# Patient Record
Sex: Male | Born: 1990 | Race: Black or African American | Hispanic: No | Marital: Single | Smoking: Current every day smoker
Health system: Southern US, Community
[De-identification: ages and names within clinical notes are randomized; demographics above are authoritative.]

## PROBLEM LIST (undated history)

## (undated) DIAGNOSIS — J45909 Unspecified asthma, uncomplicated: Secondary | ICD-10-CM

## (undated) DIAGNOSIS — I10 Essential (primary) hypertension: Secondary | ICD-10-CM

## (undated) HISTORY — PX: FRACTURE SURGERY: SHX138

---

## 2010-08-23 ENCOUNTER — Emergency Department (HOSPITAL_BASED_OUTPATIENT_CLINIC_OR_DEPARTMENT_OTHER): Admission: EM | Admit: 2010-08-23 | Discharge: 2010-08-23 | Payer: Self-pay | Admitting: Emergency Medicine

## 2010-08-23 ENCOUNTER — Ambulatory Visit: Payer: Self-pay | Admitting: Diagnostic Radiology

## 2010-11-16 ENCOUNTER — Emergency Department (HOSPITAL_BASED_OUTPATIENT_CLINIC_OR_DEPARTMENT_OTHER)
Admission: EM | Admit: 2010-11-16 | Discharge: 2010-11-16 | Payer: Self-pay | Source: Home / Self Care | Admitting: Emergency Medicine

## 2011-03-06 ENCOUNTER — Emergency Department (HOSPITAL_BASED_OUTPATIENT_CLINIC_OR_DEPARTMENT_OTHER)
Admission: EM | Admit: 2011-03-06 | Discharge: 2011-03-06 | Disposition: A | Discharge status: Home / Self Care | Payer: Medicaid Other | Attending: Emergency Medicine | Admitting: Emergency Medicine

## 2011-03-06 DIAGNOSIS — R071 Chest pain on breathing: Secondary | ICD-10-CM | POA: Insufficient documentation

## 2011-03-06 DIAGNOSIS — R0789 Other chest pain: Secondary | ICD-10-CM | POA: Insufficient documentation

## 2011-05-08 ENCOUNTER — Emergency Department (HOSPITAL_BASED_OUTPATIENT_CLINIC_OR_DEPARTMENT_OTHER)
Admission: EM | Admit: 2011-05-08 | Discharge: 2011-05-08 | Disposition: A | Discharge status: Home / Self Care | Payer: Medicaid Other | Attending: Emergency Medicine | Admitting: Emergency Medicine

## 2011-05-08 DIAGNOSIS — H5710 Ocular pain, unspecified eye: Secondary | ICD-10-CM | POA: Insufficient documentation

## 2011-05-08 DIAGNOSIS — H209 Unspecified iridocyclitis: Secondary | ICD-10-CM | POA: Insufficient documentation

## 2011-05-08 DIAGNOSIS — W219XXA Striking against or struck by unspecified sports equipment, initial encounter: Secondary | ICD-10-CM | POA: Insufficient documentation

## 2011-05-08 DIAGNOSIS — J45909 Unspecified asthma, uncomplicated: Secondary | ICD-10-CM | POA: Insufficient documentation

## 2011-05-08 DIAGNOSIS — Y9367 Activity, basketball: Secondary | ICD-10-CM | POA: Insufficient documentation

## 2011-05-25 ENCOUNTER — Encounter: Payer: Self-pay | Admitting: Emergency Medicine

## 2011-05-25 ENCOUNTER — Emergency Department (HOSPITAL_BASED_OUTPATIENT_CLINIC_OR_DEPARTMENT_OTHER)
Admission: EM | Admit: 2011-05-25 | Discharge: 2011-05-25 | Disposition: A | Discharge status: Home / Self Care | Payer: Medicaid Other | Attending: Emergency Medicine | Admitting: Emergency Medicine

## 2011-05-25 ENCOUNTER — Emergency Department (INDEPENDENT_AMBULATORY_CARE_PROVIDER_SITE_OTHER): Payer: Medicaid Other

## 2011-05-25 DIAGNOSIS — M25541 Pain in joints of right hand: Secondary | ICD-10-CM

## 2011-05-25 DIAGNOSIS — M25549 Pain in joints of unspecified hand: Secondary | ICD-10-CM

## 2011-05-25 MED ORDER — IBUPROFEN 800 MG PO TABS: 800.0000 mg | ORAL_TABLET | Freq: Three times a day (TID) | ORAL | Status: AC

## 2011-05-25 NOTE — ED Notes (Signed)
Report received from Shary Key, RN. Pt states he has been to xray and is awaiting results. Ice pack given.

## 2011-05-25 NOTE — ED Notes (Signed)
No PCP 

## 2011-05-25 NOTE — ED Provider Notes (Signed)
History     Chief Complaint  Patient presents with  . Hand Injury   HPI Pt hit back of right hand on a door while feeding his dogs this am.  Has pain at 3rd MCP joint when he makes a fist and it feels like something in joint is moving when he palpates.  Has not taken anything for pain.   History reviewed. No pertinent past medical history.  History reviewed. No pertinent past surgical history.  No family history on file.  History  Substance Use Topics  . Smoking status: Never Smoker   . Smokeless tobacco: Not on file  . Alcohol Use: No      Review of Systems  All other systems reviewed and are negative.    Physical Exam  BP 130/73  Pulse 73  Temp(Src) 98.5 F (36.9 C) (Oral)  Resp 16  Ht 5\' 10"  (1.778 m)  Wt 149 lb (67.586 kg)  BMI 21.38 kg/m2  SpO2 100%  Physical Exam  Nursing note and vitals reviewed. Constitutional: He is oriented to person, place, and time. He appears well-developed and well-nourished. No distress.  HENT:  Head: Normocephalic and atraumatic.  Eyes:       Normal appearance  Neck: Normal range of motion.  Musculoskeletal:       Hand w/out deformity, ecchymosis or edema.  No bony tenderness.  Full active ROM of wrist and fingers.  Pt reports pain at dorsal surface of right MCP joint when makes a fist.   Neurological: He is alert and oriented to person, place, and time.  Psychiatric: He has a normal mood and affect. His behavior is normal.    ED Course  Procedures  MDM Pt presents w/ traumatic right hand pain.  No signs of trauma on exam.  Xray ordered in triage and neg for fx/dislocation.  Pt requested buddy taping of fingers for comfort and performed by nursing staff.  Discharged home w/ ibuprofen for pain.  Recommended ice and elevation as well.   Medical screening examination/treatment/procedure(s) were performed by non-physician practitioner and as supervising physician I was immediately available for  consultation/collaboration.   Arie Sabina Union, PA 05/25/11 1542  Suzi Roots, MD 06/10/11 [phone]

## 2011-05-25 NOTE — ED Notes (Signed)
Pt c/o RT hand pain s/p hitting it on a door while feeding dog

## 2011-10-07 ENCOUNTER — Emergency Department (HOSPITAL_BASED_OUTPATIENT_CLINIC_OR_DEPARTMENT_OTHER)
Admission: EM | Admit: 2011-10-07 | Discharge: 2011-10-07 | Disposition: A | Discharge status: Home / Self Care | Payer: Medicaid Other | Attending: Emergency Medicine | Admitting: Emergency Medicine

## 2011-10-07 ENCOUNTER — Encounter (HOSPITAL_BASED_OUTPATIENT_CLINIC_OR_DEPARTMENT_OTHER): Payer: Self-pay | Admitting: Emergency Medicine

## 2011-10-07 DIAGNOSIS — S335XXA Sprain of ligaments of lumbar spine, initial encounter: Secondary | ICD-10-CM | POA: Insufficient documentation

## 2011-10-07 DIAGNOSIS — S39012A Strain of muscle, fascia and tendon of lower back, initial encounter: Secondary | ICD-10-CM

## 2011-10-07 DIAGNOSIS — Y92410 Unspecified street and highway as the place of occurrence of the external cause: Secondary | ICD-10-CM | POA: Insufficient documentation

## 2011-10-07 MED ORDER — CYCLOBENZAPRINE HCL 10 MG PO TABS: 10.0000 mg | ORAL_TABLET | Freq: Two times a day (BID) | ORAL | Status: AC | PRN

## 2011-10-07 MED ORDER — IBUPROFEN 800 MG PO TABS
800.0000 mg | ORAL_TABLET | Freq: Once | ORAL | Status: AC
  Administered 2011-10-07: 800 mg via ORAL
  Filled 2011-10-07 (×2): qty 1

## 2011-10-07 MED ORDER — IBUPROFEN 800 MG PO TABS: 800.0000 mg | ORAL_TABLET | Freq: Three times a day (TID) | ORAL | Status: AC
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
## 2012-01-02 ENCOUNTER — Emergency Department (HOSPITAL_BASED_OUTPATIENT_CLINIC_OR_DEPARTMENT_OTHER)
Admission: EM | Admit: 2012-01-02 | Discharge: 2012-01-02 | Disposition: A | Discharge status: Home / Self Care | Payer: Medicaid Other | Attending: Emergency Medicine | Admitting: Emergency Medicine

## 2012-01-02 ENCOUNTER — Encounter (HOSPITAL_BASED_OUTPATIENT_CLINIC_OR_DEPARTMENT_OTHER): Payer: Self-pay | Admitting: *Deleted

## 2012-01-02 ENCOUNTER — Emergency Department (INDEPENDENT_AMBULATORY_CARE_PROVIDER_SITE_OTHER): Payer: Medicaid Other

## 2012-01-02 DIAGNOSIS — X838XXA Intentional self-harm by other specified means, initial encounter: Secondary | ICD-10-CM

## 2012-01-02 DIAGNOSIS — S62309A Unspecified fracture of unspecified metacarpal bone, initial encounter for closed fracture: Secondary | ICD-10-CM | POA: Insufficient documentation

## 2012-01-02 DIAGNOSIS — S62339A Displaced fracture of neck of unspecified metacarpal bone, initial encounter for closed fracture: Secondary | ICD-10-CM | POA: Insufficient documentation

## 2012-01-02 DIAGNOSIS — W2209XA Striking against other stationary object, initial encounter: Secondary | ICD-10-CM | POA: Insufficient documentation

## 2012-01-02 DIAGNOSIS — Y92009 Unspecified place in unspecified non-institutional (private) residence as the place of occurrence of the external cause: Secondary | ICD-10-CM | POA: Insufficient documentation

## 2012-01-02 DIAGNOSIS — Y9383 Activity, rough housing and horseplay: Secondary | ICD-10-CM | POA: Insufficient documentation

## 2012-01-02 DIAGNOSIS — S62306A Unspecified fracture of fifth metacarpal bone, right hand, initial encounter for closed fracture: Secondary | ICD-10-CM

## 2012-01-02 MED ORDER — HYDROCODONE-ACETAMINOPHEN 5-325 MG PO TABS
2.0000 | ORAL_TABLET | Freq: Once | ORAL | Status: AC
  Administered 2012-01-02: 2 via ORAL
  Filled 2012-01-02: qty 2

## 2012-01-02 MED ORDER — IBUPROFEN 800 MG PO TABS
800.0000 mg | ORAL_TABLET | Freq: Once | ORAL | Status: AC
  Administered 2012-01-02: 800 mg via ORAL
  Filled 2012-01-02: qty 1

## 2012-01-02 MED ORDER — HYDROCODONE-ACETAMINOPHEN 5-325 MG PO TABS: 2.0000 | ORAL_TABLET | Freq: Four times a day (QID) | ORAL | Status: AC | PRN

## 2012-01-02 MED ORDER — IBUPROFEN 800 MG PO TABS: 800.0000 mg | ORAL_TABLET | Freq: Three times a day (TID) | ORAL | Status: AC | PRN

## 2012-01-02 NOTE — ED Notes (Signed)
Patient transported to X-ray 

## 2012-01-02 NOTE — Discharge Instructions (Signed)
Hand Fracture, Metacarpals Fractures of metacarpals are breaks in the bones of the hand. They extend from the knuckles to the wrist. These bones can undergo many types of fractures. There are different ways of treating these fractures, all of which may be correct. TREATMENT  Hand fractures can be treated with:   Non-reduction - The fracture is casted without changing the positions of the fracture (bone pieces) involved. This fracture is usually left in a cast for 4 to 6 weeks or as your caregiver thinks necessary.   Closed reduction - The bones are moved back into position without surgery and then casted.   ORIF (open reduction and internal fixation) - The fracture site is opened and the bone pieces are fixed into place with some type of hardware, such as screws, etc. They are then casted.  Your caregiver will discuss the type of fracture you have and the treatment that should be best for that problem. If surgery is chosen, let your caregivers know about the following.  LET YOUR CAREGIVERS KNOW ABOUT:  Allergies.   Medications you are taking, including herbs, eye drops, over the counter medications, and creams.   Use of steroids (by mouth or creams).   Previous problems with anesthetics or novocaine.   Possibility of pregnancy.   History of blood clots (thrombophlebitis).   History of bleeding or blood problems.   Previous surgeries.   Other health problems.  AFTER THE PROCEDURE After surgery, you will be taken to the recovery area where a nurse will watch and check your progress. Once you are awake, stable, and taking fluids well, barring other problems, you'll be allowed to go home. Once home, an ice pack applied to your operative site may help with pain and keep the swelling down. HOME CARE INSTRUCTIONS   Follow your caregiver's instructions as to activities, exercises, physical therapy, and driving a car.   Daily exercise is helpful for keeping range of motion and strength.  Exercise as instructed.   To lessen swelling, keep the injured hand elevated above the level of your heart as much as possible.   Apply ice to the injury for 15 to 20 minutes each hour while awake for the first 2 days. Put the ice in a plastic bag and place a thin towel between the bag of ice and your cast.   Move the fingers of your casted hand several times a day.   If a plaster or fiberglass cast was applied:   Do not try to scratch the skin under the cast using a sharp or pointed object.   Check the skin around the cast every day. You may put lotion on red or sore areas.   Keep your cast dry. Your cast can be protected during bathing with a plastic bag. Do not put your cast into the water.   If a plaster splint was applied:   Wear your splint for as long as directed by your caregiver or until seen again.   Do not get your splint wet. Protect it during bathing with a plastic bag.   You may loosen the elastic bandage around the splint if your fingers start to get numb, tingle, get cold or turn blue.   Do not put pressure on your cast or splint; this may cause it to break. Especially, do not lean plaster casts on hard surfaces for 24 hours after application.   Take medications as directed by your caregiver.   Only take over-the-counter or prescription medicines for pain,   discomfort, or fever as directed by your caregiver.   Follow-up as provided by your caregiver. This is very important in order to avoid permanent injury or disability and chronic pain.  SEEK MEDICAL CARE IF:   Increased bleeding (more than a small spot) from beneath your cast or splint if there is beneath the cast as with an open reduction.   Redness, swelling, or increasing pain in the wound or from beneath your cast or splint.   Pus coming from wound or from beneath your cast or splint.   An unexplained oral temperature above 102 F (38.9 C) develops, or as your caregiver suggests.   A foul smell coming  from the wound or dressing or from beneath your cast or splint.   You have a problem moving any of your fingers.  SEEK IMMEDIATE MEDICAL CARE IF:   You develop a rash   You have difficulty breathing   You have any allergy problems  If you do not have a window in your cast for observing the wound, a discharge or minor bleeding may show up as a stain on the outside of your cast. Report these findings to your caregiver. MAKE SURE YOU:   Understand these instructions.   Will watch your condition.   Will get help right away if you are not doing well or get worse.  Document Released: 10/20/2005 Document Revised: 07/02/2011 Document Reviewed: 06/08/2008 [employer] Patient Information 2012 [employer]. 
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
## 2012-01-02 NOTE — ED Notes (Signed)
Punched a wall with his fist 3 days ago. Now his right hand is painful. Hx of fx.

## 2012-01-20 ENCOUNTER — Ambulatory Visit: Payer: Medicaid Other | Attending: Orthopedic Surgery | Admitting: Occupational Therapy

## 2012-01-20 DIAGNOSIS — M25649 Stiffness of unspecified hand, not elsewhere classified: Secondary | ICD-10-CM | POA: Insufficient documentation

## 2012-01-20 DIAGNOSIS — M25549 Pain in joints of unspecified hand: Secondary | ICD-10-CM | POA: Insufficient documentation

## 2012-01-20 DIAGNOSIS — IMO0001 Reserved for inherently not codable concepts without codable children: Secondary | ICD-10-CM | POA: Insufficient documentation

## 2012-10-27 ENCOUNTER — Encounter (HOSPITAL_BASED_OUTPATIENT_CLINIC_OR_DEPARTMENT_OTHER): Payer: Self-pay | Admitting: Emergency Medicine

## 2012-10-27 ENCOUNTER — Emergency Department (HOSPITAL_BASED_OUTPATIENT_CLINIC_OR_DEPARTMENT_OTHER)
Admission: EM | Admit: 2012-10-27 | Discharge: 2012-10-27 | Disposition: A | Discharge status: Home / Self Care | Payer: Medicaid Other | Attending: Emergency Medicine | Admitting: Emergency Medicine

## 2012-10-27 DIAGNOSIS — J029 Acute pharyngitis, unspecified: Secondary | ICD-10-CM | POA: Insufficient documentation

## 2012-10-27 DIAGNOSIS — F17200 Nicotine dependence, unspecified, uncomplicated: Secondary | ICD-10-CM | POA: Insufficient documentation

## 2012-10-27 DIAGNOSIS — R059 Cough, unspecified: Secondary | ICD-10-CM | POA: Insufficient documentation

## 2012-10-27 DIAGNOSIS — R05 Cough: Secondary | ICD-10-CM | POA: Insufficient documentation

## 2012-10-27 MED ORDER — ACETAMINOPHEN-CODEINE 120-12 MG/5ML PO SUSP: 10.0000 mL | Freq: Four times a day (QID) | ORAL | Status: DC | PRN

## 2012-10-27 NOTE — ED Provider Notes (Signed)
History     CSN: [id number]  Arrival date & time 10/27/12  1307   First MD Initiated Contact with Patient 10/27/12 1316      Chief Complaint  Patient presents with  . Cough    (Consider location/radiation/quality/duration/timing/severity/associated sxs/prior treatment) Patient is a 21 y.o. male presenting with cough. The history is provided by the patient. No language interpreter was used.  Cough This is a new problem. The current episode started yesterday. The problem occurs constantly. The problem has not changed since onset.The cough is productive of sputum. Associated symptoms include ear congestion and sore throat. He has tried nothing for the symptoms. He is a smoker.    History reviewed. No pertinent past medical history.  History reviewed. No pertinent past surgical history.  History reviewed. No pertinent family history.  History  Substance Use Topics  . Smoking status: Current Every Day Smoker -- 1.0 packs/day    Types: Cigarettes  . Smokeless tobacco: Not on file  . Alcohol Use: No      Review of Systems  Constitutional: Negative.   HENT: Positive for sore throat.   Respiratory: Positive for cough.   Cardiovascular: Negative.     Allergies  Review of patient's allergies indicates no known allergies.  Home Medications   Current Outpatient Rx  Name  Route  Sig  Dispense  Refill  . ACETAMINOPHEN-CODEINE 120-12 MG/5ML PO SUSP   Oral   Take 10 mLs by mouth every 6 (six) hours as needed for pain.   60 mL   0     BP 146/90  Pulse 77  Temp 99.1 F (37.3 C) (Oral)  Resp 18  SpO2 100%  Physical Exam  Nursing note and vitals reviewed. Constitutional: He appears well-developed and well-nourished.  HENT:  Head: Normocephalic and atraumatic.  Right Ear: External ear normal.  Left Ear: External ear normal.       Pharyngeal erythema   Neck: Normal range of motion. Neck supple.  Cardiovascular: Normal rate and regular rhythm.   Pulmonary/Chest:  Effort normal and breath sounds normal.  Musculoskeletal: Normal range of motion.  Neurological: He is alert.    ED Course  Procedures (including critical care time)  Labs Reviewed - No data to display No results found.   1. Cough       MDM  Likely viral        Teressa Lower, NP 10/27/12 1341

## 2012-10-27 NOTE — ED Notes (Signed)
Pt started developing cold symptoms yesterday, +cough, fever

## 2012-10-27 NOTE — ED Provider Notes (Signed)
Medical screening examination/treatment/procedure(s) were performed by non-physician practitioner and as supervising physician I was immediately available for consultation/collaboration.  Ethelda Chick, MD 10/27/12 [phone]

## 2014-12-29 ENCOUNTER — Emergency Department (HOSPITAL_BASED_OUTPATIENT_CLINIC_OR_DEPARTMENT_OTHER): Payer: Medicaid Other

## 2014-12-29 ENCOUNTER — Emergency Department (HOSPITAL_BASED_OUTPATIENT_CLINIC_OR_DEPARTMENT_OTHER)
Admission: EM | Admit: 2014-12-29 | Discharge: 2014-12-30 | Disposition: A | Discharge status: Home / Self Care | Payer: Medicaid Other | Attending: Emergency Medicine | Admitting: Emergency Medicine

## 2014-12-29 ENCOUNTER — Encounter (HOSPITAL_BASED_OUTPATIENT_CLINIC_OR_DEPARTMENT_OTHER): Payer: Self-pay | Admitting: Emergency Medicine

## 2014-12-29 DIAGNOSIS — S8991XA Unspecified injury of right lower leg, initial encounter: Secondary | ICD-10-CM | POA: Diagnosis present

## 2014-12-29 DIAGNOSIS — M25561 Pain in right knee: Secondary | ICD-10-CM

## 2014-12-29 DIAGNOSIS — Y9389 Activity, other specified: Secondary | ICD-10-CM | POA: Diagnosis not present

## 2014-12-29 DIAGNOSIS — S0990XA Unspecified injury of head, initial encounter: Secondary | ICD-10-CM | POA: Diagnosis not present

## 2014-12-29 DIAGNOSIS — Y998 Other external cause status: Secondary | ICD-10-CM | POA: Insufficient documentation

## 2014-12-29 DIAGNOSIS — Z720 Tobacco use: Secondary | ICD-10-CM | POA: Insufficient documentation

## 2014-12-29 DIAGNOSIS — J45909 Unspecified asthma, uncomplicated: Secondary | ICD-10-CM | POA: Diagnosis not present

## 2014-12-29 DIAGNOSIS — Y92410 Unspecified street and highway as the place of occurrence of the external cause: Secondary | ICD-10-CM | POA: Diagnosis not present

## 2014-12-29 HISTORY — DX: Unspecified asthma, uncomplicated: J45.909

## 2014-12-29 MED ORDER — OXYCODONE-ACETAMINOPHEN 5-325 MG PO TABS
2.0000 | ORAL_TABLET | Freq: Once | ORAL | Status: AC
  Administered 2014-12-29: 2 via ORAL
  Filled 2014-12-29: qty 2
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
## 2014-12-29 NOTE — ED Notes (Signed)
Pt has bloodshot eyes
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
## 2014-12-30 MED ORDER — TRAMADOL HCL 50 MG PO TABS: 50.0000 mg | ORAL_TABLET | Freq: Four times a day (QID) | ORAL | Status: DC | PRN
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
## 2016-09-06 ENCOUNTER — Emergency Department (HOSPITAL_BASED_OUTPATIENT_CLINIC_OR_DEPARTMENT_OTHER): Payer: Medicaid Other

## 2016-09-06 ENCOUNTER — Encounter (HOSPITAL_BASED_OUTPATIENT_CLINIC_OR_DEPARTMENT_OTHER): Payer: Self-pay | Admitting: Emergency Medicine

## 2016-09-06 ENCOUNTER — Emergency Department (HOSPITAL_BASED_OUTPATIENT_CLINIC_OR_DEPARTMENT_OTHER)
Admission: EM | Admit: 2016-09-06 | Discharge: 2016-09-06 | Disposition: A | Discharge status: Home / Self Care | Payer: Medicaid Other | Attending: Emergency Medicine | Admitting: Emergency Medicine

## 2016-09-06 DIAGNOSIS — Y998 Other external cause status: Secondary | ICD-10-CM | POA: Insufficient documentation

## 2016-09-06 DIAGNOSIS — Y929 Unspecified place or not applicable: Secondary | ICD-10-CM | POA: Insufficient documentation

## 2016-09-06 DIAGNOSIS — W2101XA Struck by football, initial encounter: Secondary | ICD-10-CM | POA: Diagnosis not present

## 2016-09-06 DIAGNOSIS — J45909 Unspecified asthma, uncomplicated: Secondary | ICD-10-CM | POA: Diagnosis not present

## 2016-09-06 DIAGNOSIS — S6992XA Unspecified injury of left wrist, hand and finger(s), initial encounter: Secondary | ICD-10-CM | POA: Diagnosis present

## 2016-09-06 DIAGNOSIS — S62635A Displaced fracture of distal phalanx of left ring finger, initial encounter for closed fracture: Secondary | ICD-10-CM | POA: Insufficient documentation

## 2016-09-06 DIAGNOSIS — Y9361 Activity, american tackle football: Secondary | ICD-10-CM | POA: Insufficient documentation

## 2016-09-06 DIAGNOSIS — S62609A Fracture of unspecified phalanx of unspecified finger, initial encounter for closed fracture: Secondary | ICD-10-CM

## 2016-09-06 DIAGNOSIS — F17210 Nicotine dependence, cigarettes, uncomplicated: Secondary | ICD-10-CM | POA: Diagnosis not present

## 2016-09-06 MED ORDER — ACETAMINOPHEN 500 MG PO TABS
1000.0000 mg | ORAL_TABLET | Freq: Once | ORAL | Status: AC
  Administered 2016-09-06: 1000 mg via ORAL
  Filled 2016-09-06: qty 2

## 2016-09-06 MED ORDER — IBUPROFEN 400 MG PO TABS
400.0000 mg | ORAL_TABLET | Freq: Once | ORAL | Status: AC
  Administered 2016-09-06: 400 mg via ORAL
  Filled 2016-09-06: qty 1

## 2016-09-06 NOTE — ED Notes (Signed)
Pt was throwing football and injured his left ring finger. MD at bedside to reduce.

## 2016-09-06 NOTE — ED Provider Notes (Signed)
[facility] Note   CSN: [id number] Arrival date & time: 09/06/16  1257   By signing my name below, I, Arianna Nassar, attest that this documentation has been prepared under the direction and in the presence of Alvira MondayErin Bernarda Erck, MD.  Electronically Signed: Octavia HeirArianna Nassar, ED Scribe. 09/06/16. 3:19 PM.   History   Chief Complaint Chief Complaint  Patient presents with  . Finger Injury    The history is provided by the patient. No language interpreter was used.   HPI Comments: Jeffrey Newman is a 25 y.o. male who is right hand dominant presents to the Emergency Department complaining of 10/10, gradual worsening, left ring finger pain s/p an injury that occurred earlier this afternoon. Associated numbness at the tip of the finger. Pt reports that he was trying to catch a football with his left hand and the ball hit his left ring finger. Pt has not had similar pain in this finger in the past. He is unable to make a fist with his left hand secondary to pain. Pt has not taken any medication to relieve his pain. There are no other complaints. Pt has not known drug allergies.   Past Medical History:  Diagnosis Date  . Asthma     There are no active problems to display for this patient.   Past Surgical History:  Procedure Laterality Date  . FRACTURE SURGERY         Home Medications    Prior to Admission medications   Medication Sig Start Date End Date Taking? Authorizing Provider  acetaminophen-codeine 120-12 MG/5ML suspension Take 10 mLs by mouth every 6 (six) hours as needed for pain. 10/27/12   Teressa LowerVrinda Pickering, NP  traMADol (ULTRAM) 50 MG tablet Take 1 tablet (50 mg total) by mouth every 6 (six) hours as needed. 12/30/14   Dione Boozeavid Glick, MD    Family History History reviewed. No pertinent family history.  Social History Social History  Substance Use Topics  . Smoking status: Current Every Day Smoker    Packs/day: 0.50    Types: Cigarettes  .  Smokeless tobacco: Not on file  . Alcohol use Yes     Allergies   Patient has no known allergies.   Review of Systems Review of Systems  Constitutional: Negative for fever.  Respiratory: Negative for shortness of breath.   Cardiovascular: Negative for chest pain.  Gastrointestinal: Negative for nausea and vomiting.  Musculoskeletal: Positive for arthralgias. Negative for back pain and neck pain.  Skin: Negative for wound.  Neurological: Positive for numbness. Negative for headaches.  All other systems reviewed and are negative.    Physical Exam Updated Vital Signs BP 129/78   Pulse (!) 58   Temp 98.1 F (36.7 C)   Resp 16   Ht 6' (1.829 m)   Wt 160 lb (72.6 kg)   SpO2 100%   BMI 21.70 kg/m   Physical Exam  Constitutional: He is oriented to person, place, and time. He appears well-developed and well-nourished.  HENT:  Head: Normocephalic.  Eyes: EOM are normal.  Neck: Normal range of motion.  Cardiovascular: Normal rate, regular rhythm and intact distal pulses.   Pulmonary/Chest: Effort normal. No respiratory distress.  Abdominal: He exhibits no distension.  Musculoskeletal: Normal range of motion. He exhibits tenderness.  pulses intact,  2+ radial pulses, prior to reduction unable to flex/extend fingers or hand secondary to pain. tenderness over DIP joint of the left ring finger, no tenderness to rest of left hand, good cap  refill,   Post reduction able to flex and extend the DIP and PIP joint after reduction, normal sensation post reduction, normal cap refill  Neurological: He is alert and oriented to person, place, and time.  Skin: Capillary refill takes less than 2 seconds.  Psychiatric: He has a normal mood and affect.  Nursing note and vitals reviewed.    ED Treatments / Results  DIAGNOSTIC STUDIES: Oxygen Saturation is 98% on RA, normal by my interpretation.  COORDINATION OF CARE:  3:11 PM Discussed treatment plan with pt at bedside and pt agreed to  plan. Will order second DG of left hand and tylenol/ibuprofen. Pt will receive a splint for his left ring finger.  Labs (all labs ordered are listed, but only abnormal results are displayed) Labs Reviewed - No data to display  EKG  EKG Interpretation None       Radiology Dg Hand Complete Left  Result Date: 09/06/2016 CLINICAL DATA:  Blunt trauma to the fourth digit playing football with pain, initial encounter EXAM: LEFT HAND - COMPLETE 3+ VIEW COMPARISON:  05/22/2013 FINDINGS: There is dislocation of the fourth DIP joint with posterior displacement of the distal phalanx with respect to the middle phalanx. Few tiny bony densities are noted on the lateral projection which may be related to small avulsions. No other focal abnormality is seen. IMPRESSION: Fourth DIP joint dislocation. There may be a few small avulsions noted on the lateral projection volarly. Electronically Signed   By: Alcide Clever M.D.   On: 09/06/2016 13:54   Dg Finger Ring Left  Result Date: 09/06/2016 CLINICAL DATA:  Dislocation LEFT ring finger post reduction EXAM: LEFT RING FINGER 2+V COMPARISON:  Earlier LEFT hand radiographs of 09/06/2016 FINDINGS: Osseous mineralization normal. Joint spaces normal. Previously identified dorsal dislocation of DIP joint LEFT ring finger appears reduced. Tiny bone density seen volar to the head of the middle phalanx question tiny fracture fragment again noted. No additional fracture seen. IMPRESSION: Interval reduction of DIP dislocation LEFT ring finger. Question tiny displaced fracture fragment at volar aspect of DIP joint. Electronically Signed   By: Ulyses Southward M.D.   On: 09/06/2016 15:39    Procedures Reduction of dislocation Date/Time: 09/07/2016 9:38 AM Performed by: Alvira Monday Authorized by: Alvira Monday  Consent: Verbal consent obtained. Risks and benefits: risks, benefits and alternatives were discussed Consent given by: patient Patient understanding: patient  states understanding of the procedure being performed Patient consent: the patient's understanding of the procedure matches consent given Procedure consent: procedure consent matches procedure scheduled Relevant documents: relevant documents present and verified Test results: test results available and properly labeled Site marked: the operative site was marked Imaging studies: imaging studies available Required items: required blood products, implants, devices, and special equipment available Patient identity confirmed: verbally with patient Time out: Immediately prior to procedure a "time out" was called to verify the correct patient, procedure, equipment, support staff and site/side marked as required. Local anesthesia used: no  Anesthesia: Local anesthesia used: no  Sedation: Patient sedated: no Patient tolerance: Patient tolerated the procedure well with no immediate complications    (including critical care time)  Medications Ordered in ED Medications  ibuprofen (ADVIL,MOTRIN) tablet 400 mg (400 mg Oral Given 09/06/16 1523)  acetaminophen (TYLENOL) tablet 1,000 mg (1,000 mg Oral Given 09/06/16 1523)     Initial Impression / Assessment and Plan / ED Course  I have reviewed the triage vital signs and the nursing notes.  Pertinent labs & imaging results that were  available during my care of the patient were reviewed by me and considered in my medical decision making (see chart for details).  Clinical Course    25yo male presents with concern for finger pain and deformity. XR shows dorsal dislocation with possible small avulsion fracture fragments.  Reduction performed at bedside with repeat exam showing ability to flex and extend, resolution of sensation of numbness, normal cap refill. Placed in splint, given number for hand surgery follow up. Patient discharged in stable condition with understanding of reasons to return.   Final Clinical Impressions(s) / ED Diagnoses   Final  diagnoses:  Closed fracture dislocation of distal interphalangeal (DIP) joint of finger, initial encounter, left ring finger  I personally performed the services described in this documentation, which was scribed in my presence. The recorded information has been reviewed and is accurate.  New Prescriptions Discharge Medication List as of 09/06/2016  3:55 PM       Alvira MondayErin Guillermo Nehring, MD 09/07/16 [phone]

## 2016-09-06 NOTE — ED Triage Notes (Signed)
Pt in with finger pain after catching a football and having acute pain. States he thinks it is broken, cannot make a fist. Pt alert, interactive, ambulatory in NAD.

## 2016-12-13 ENCOUNTER — Encounter (HOSPITAL_BASED_OUTPATIENT_CLINIC_OR_DEPARTMENT_OTHER): Payer: Self-pay | Admitting: *Deleted

## 2016-12-13 ENCOUNTER — Emergency Department (HOSPITAL_BASED_OUTPATIENT_CLINIC_OR_DEPARTMENT_OTHER): Payer: Medicaid Other

## 2016-12-13 ENCOUNTER — Emergency Department (HOSPITAL_BASED_OUTPATIENT_CLINIC_OR_DEPARTMENT_OTHER)
Admission: EM | Admit: 2016-12-13 | Discharge: 2016-12-13 | Disposition: A | Discharge status: Home / Self Care | Payer: Medicaid Other | Attending: Emergency Medicine | Admitting: Emergency Medicine

## 2016-12-13 DIAGNOSIS — Y92410 Unspecified street and highway as the place of occurrence of the external cause: Secondary | ICD-10-CM | POA: Diagnosis not present

## 2016-12-13 DIAGNOSIS — F17210 Nicotine dependence, cigarettes, uncomplicated: Secondary | ICD-10-CM | POA: Diagnosis not present

## 2016-12-13 DIAGNOSIS — Y9389 Activity, other specified: Secondary | ICD-10-CM | POA: Diagnosis not present

## 2016-12-13 DIAGNOSIS — R519 Headache, unspecified: Secondary | ICD-10-CM

## 2016-12-13 DIAGNOSIS — S3992XA Unspecified injury of lower back, initial encounter: Secondary | ICD-10-CM | POA: Diagnosis present

## 2016-12-13 DIAGNOSIS — S39012A Strain of muscle, fascia and tendon of lower back, initial encounter: Secondary | ICD-10-CM | POA: Diagnosis not present

## 2016-12-13 DIAGNOSIS — Y999 Unspecified external cause status: Secondary | ICD-10-CM | POA: Diagnosis not present

## 2016-12-13 DIAGNOSIS — R51 Headache: Secondary | ICD-10-CM | POA: Insufficient documentation

## 2016-12-13 MED ORDER — CYCLOBENZAPRINE HCL 10 MG PO TABS
10.0000 mg | ORAL_TABLET | Freq: Two times a day (BID) | ORAL | 0 refills | Status: DC | PRN
Start: 2016-12-13 — End: 2020-09-21

## 2016-12-13 MED ORDER — KETOROLAC TROMETHAMINE 30 MG/ML IJ SOLN
30.0000 mg | Freq: Once | INTRAMUSCULAR | Status: DC
  Filled 2016-12-13: qty 1

## 2016-12-13 MED ORDER — NAPROXEN 375 MG PO TABS: 375.0000 mg | ORAL_TABLET | Freq: Two times a day (BID) | ORAL | 0 refills | Status: DC

## 2016-12-13 NOTE — Discharge Instructions (Signed)
All other imaging has been normal today. Have given a prescription for naproxen. Please start taking this tomorrow. Avoid using any other NSAIDs including ibuprofen, Advil, Motrin. May take Tylenol though. Take the Flexeril for muscle relaxation. This medicine will make you drowsy so do not drive with it. This is likely musculoskeletal strain. Follow up with your chiropractor on Monday. Return to the ED develop any worsening symptoms.
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
## 2017-06-17 ENCOUNTER — Encounter (HOSPITAL_BASED_OUTPATIENT_CLINIC_OR_DEPARTMENT_OTHER): Payer: Self-pay | Admitting: *Deleted

## 2017-06-17 ENCOUNTER — Emergency Department (HOSPITAL_BASED_OUTPATIENT_CLINIC_OR_DEPARTMENT_OTHER)
Admission: EM | Admit: 2017-06-17 | Discharge: 2017-06-17 | Disposition: A | Discharge status: Home / Self Care | Payer: Medicaid Other | Attending: Emergency Medicine | Admitting: Emergency Medicine

## 2017-06-17 DIAGNOSIS — R0981 Nasal congestion: Secondary | ICD-10-CM | POA: Diagnosis present

## 2017-06-17 DIAGNOSIS — Z79899 Other long term (current) drug therapy: Secondary | ICD-10-CM | POA: Diagnosis not present

## 2017-06-17 DIAGNOSIS — F17210 Nicotine dependence, cigarettes, uncomplicated: Secondary | ICD-10-CM | POA: Insufficient documentation

## 2017-06-17 DIAGNOSIS — J069 Acute upper respiratory infection, unspecified: Secondary | ICD-10-CM | POA: Diagnosis not present

## 2017-06-17 MED ORDER — NAPROXEN 375 MG PO TABS: 375.0000 mg | ORAL_TABLET | Freq: Two times a day (BID) | ORAL | 0 refills | Status: DC

## 2017-06-17 MED ORDER — PSEUDOEPHEDRINE HCL 30 MG/5ML PO SYRP
30.0000 mg | ORAL_SOLUTION | Freq: Once | ORAL | Status: DC
  Filled 2017-06-17: qty 5

## 2017-06-17 MED ORDER — PSEUDOEPHEDRINE HCL 30 MG PO TABS
30.0000 mg | ORAL_TABLET | Freq: Three times a day (TID) | ORAL | Status: AC | PRN
  Administered 2017-06-17: 30 mg via ORAL

## 2017-06-17 MED ORDER — NAPROXEN 250 MG PO TABS
500.0000 mg | ORAL_TABLET | Freq: Once | ORAL | Status: AC
  Administered 2017-06-17: 500 mg via ORAL
  Filled 2017-06-17: qty 2

## 2017-06-17 MED ORDER — PSEUDOEPHEDRINE HCL 30 MG PO TABS
ORAL_TABLET | ORAL | Status: AC
  Filled 2017-06-17: qty 1

## 2017-06-17 MED ORDER — PSEUDOEPHEDRINE HCL 30 MG/5ML PO SYRP: 30.0000 mg | ORAL_SOLUTION | Freq: Three times a day (TID) | ORAL | 0 refills | Status: DC | PRN

## 2017-06-17 NOTE — ED Triage Notes (Signed)
Pt states nasal congestion, cough and throat irritation since yesterday. Denies fevers

## 2017-06-17 NOTE — ED Provider Notes (Signed)
[facility]   CSN: [id number] Arrival date & time: 06/17/17  0000     History   Chief Complaint Chief Complaint  Patient presents with  . Nasal Congestion    HPI Jeffrey Newman is a 26 y.o. male.  HPI Pt comes in with cc of congestion and sore throat. Pt started feeling congested 2 days ago and now he has some pain with  swallowing. Pt also has a cough, dry. No chest pain, dib, n/v/f/c   History reviewed. No pertinent past medical history.  There are no active problems to display for this patient.   Past Surgical History:  Procedure Laterality Date  . FRACTURE SURGERY         Home Medications    Prior to Admission medications   Medication Sig Start Date End Date Taking? Authorizing Provider  cyclobenzaprine (FLEXERIL) 10 MG tablet Take 1 tablet (10 mg total) by mouth 2 (two) times daily as needed for muscle spasms. 12/13/16   Rise Mu, PA-C  naproxen (NAPROSYN) 375 MG tablet Take 1 tablet (375 mg total) by mouth 2 (two) times daily. 06/17/17   Derwood Kaplan, MD  pseudoephedrine (SUDAFED) 30 MG/5ML syrup Take 5 mLs (30 mg total) by mouth every 8 (eight) hours as needed for congestion. 06/17/17   Derwood Kaplan, MD    Family History No family history on file.  Social History Social History  Substance Use Topics  . Smoking status: Current Every Day Smoker    Packs/day: 0.50    Types: Cigarettes  . Smokeless tobacco: Never Used  . Alcohol use Yes     Allergies   Patient has no known allergies.   Review of Systems Review of Systems  HENT: Positive for congestion, postnasal drip, rhinorrhea and sore throat. Negative for dental problem, drooling, trouble swallowing and voice change.   Respiratory: Positive for cough.   Skin: Negative for rash.  Allergic/Immunologic: Negative for immunocompromised state.     Physical Exam Updated Vital Signs BP (!) 151/94 (BP Location: Left Arm)   Pulse 68   Temp 98.2 F (36.8  C) (Oral)   Resp 18   Ht 6\' 3"  (1.905 m)   Wt 73.8 kg (162 lb 11.2 oz)   SpO2 100%   BMI 20.34 kg/m   Physical Exam  Constitutional: He is oriented to person, place, and time. He appears well-developed.  HENT:  Head: Atraumatic.  Mouth/Throat: No oropharyngeal exudate.  Pt has no trismus, stridor or crepitus over the neck. Pt has no tonsillar enlargement and exudates. Pharynx is erythematous.   Eyes: EOM are normal.  Neck: Neck supple.  Cardiovascular: Normal rate.   Pulmonary/Chest: Effort normal.  Lymphadenopathy:    He has cervical adenopathy.  Neurological: He is alert and oriented to person, place, and time.  Skin: Skin is warm.  Nursing note and vitals reviewed.    ED Treatments / Results  Labs (all labs ordered are listed, but only abnormal results are displayed) Labs Reviewed - No data to display  EKG  EKG Interpretation None       Radiology No results found.  Procedures Procedures (including critical care time)  Medications Ordered in ED Medications  pseudoephedrine (SUDAFED) 30 MG/5ML syrup 30 mg (not administered)  naproxen (NAPROSYN) tablet 500 mg (not administered)     Initial Impression / Assessment and Plan / ED Course  I have reviewed the triage vital signs and the nursing notes.  Pertinent labs & imaging results that were available  during my care of the patient were reviewed by me and considered in my medical decision making (see chart for details).    Pt comes in with cc of sore throat and nasal congestion. pt has a viral URI.    Final Clinical Impressions(s) / ED Diagnoses   Final diagnoses:  Nasal congestion  Acute upper respiratory infection    New Prescriptions New Prescriptions   NAPROXEN (NAPROSYN) 375 MG TABLET    Take 1 tablet (375 mg total) by mouth 2 (two) times daily.   PSEUDOEPHEDRINE (SUDAFED) 30 MG/5ML SYRUP    Take 5 mLs (30 mg total) by mouth every 8 (eight) hours as needed for congestion.     Derwood KaplanNanavati,  Tamorah Hada, MD 06/17/17 [phone]

## 2017-08-30 ENCOUNTER — Encounter (HOSPITAL_BASED_OUTPATIENT_CLINIC_OR_DEPARTMENT_OTHER): Payer: Self-pay | Admitting: Emergency Medicine

## 2017-08-30 ENCOUNTER — Emergency Department (HOSPITAL_BASED_OUTPATIENT_CLINIC_OR_DEPARTMENT_OTHER)
Admission: EM | Admit: 2017-08-30 | Discharge: 2017-08-30 | Disposition: A | Discharge status: Home / Self Care | Payer: Medicaid Other | Attending: Emergency Medicine | Admitting: Emergency Medicine

## 2017-08-30 DIAGNOSIS — Y929 Unspecified place or not applicable: Secondary | ICD-10-CM | POA: Insufficient documentation

## 2017-08-30 DIAGNOSIS — Y998 Other external cause status: Secondary | ICD-10-CM | POA: Diagnosis not present

## 2017-08-30 DIAGNOSIS — Y9389 Activity, other specified: Secondary | ICD-10-CM | POA: Insufficient documentation

## 2017-08-30 DIAGNOSIS — W268XXA Contact with other sharp object(s), not elsewhere classified, initial encounter: Secondary | ICD-10-CM | POA: Insufficient documentation

## 2017-08-30 DIAGNOSIS — S1191XA Laceration without foreign body of unspecified part of neck, initial encounter: Secondary | ICD-10-CM | POA: Insufficient documentation

## 2017-08-30 DIAGNOSIS — Z79899 Other long term (current) drug therapy: Secondary | ICD-10-CM | POA: Diagnosis not present

## 2017-08-30 MED ORDER — LIDOCAINE HCL 2 % IJ SOLN
10.0000 mL | Freq: Once | INTRAMUSCULAR | Status: AC
  Administered 2017-08-30: 13:00:00 via INTRADERMAL

## 2017-08-30 MED ORDER — LIDOCAINE HCL (PF) 2 % IJ SOLN
INTRAMUSCULAR | Status: AC
  Filled 2017-08-30: qty 8
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
## 2017-08-30 NOTE — Discharge Instructions (Signed)
Keep your wound clean and dry.  Apply triple antibiotic ointment twice a day.  Follow-up for suture removal in 7-10 days.  Return if any signs of infection, swelling, drainage, redness, pain.
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
## 2017-08-30 NOTE — ED Notes (Signed)
PA at bedside.

## 2018-01-30 ENCOUNTER — Emergency Department (HOSPITAL_BASED_OUTPATIENT_CLINIC_OR_DEPARTMENT_OTHER): Payer: Medicaid Other

## 2018-01-30 ENCOUNTER — Other Ambulatory Visit: Payer: Self-pay

## 2018-01-30 ENCOUNTER — Encounter (HOSPITAL_BASED_OUTPATIENT_CLINIC_OR_DEPARTMENT_OTHER): Payer: Self-pay | Admitting: Emergency Medicine

## 2018-01-30 ENCOUNTER — Emergency Department (HOSPITAL_BASED_OUTPATIENT_CLINIC_OR_DEPARTMENT_OTHER)
Admission: EM | Admit: 2018-01-30 | Discharge: 2018-01-30 | Disposition: A | Discharge status: Home / Self Care | Payer: Medicaid Other | Attending: Emergency Medicine | Admitting: Emergency Medicine

## 2018-01-30 ENCOUNTER — Emergency Department (HOSPITAL_BASED_OUTPATIENT_CLINIC_OR_DEPARTMENT_OTHER)
Admission: EM | Admit: 2018-01-30 | Discharge: 2018-01-30 | Discharge status: Against Medical Advice | Payer: Medicaid Other | Source: Home / Self Care

## 2018-01-30 DIAGNOSIS — Y998 Other external cause status: Secondary | ICD-10-CM | POA: Diagnosis not present

## 2018-01-30 DIAGNOSIS — Y929 Unspecified place or not applicable: Secondary | ICD-10-CM | POA: Insufficient documentation

## 2018-01-30 DIAGNOSIS — Y939 Activity, unspecified: Secondary | ICD-10-CM | POA: Insufficient documentation

## 2018-01-30 DIAGNOSIS — F17210 Nicotine dependence, cigarettes, uncomplicated: Secondary | ICD-10-CM | POA: Insufficient documentation

## 2018-01-30 DIAGNOSIS — S0592XA Unspecified injury of left eye and orbit, initial encounter: Secondary | ICD-10-CM | POA: Diagnosis present

## 2018-01-30 DIAGNOSIS — R600 Localized edema: Secondary | ICD-10-CM | POA: Diagnosis not present

## 2018-01-30 DIAGNOSIS — S0012XA Contusion of left eyelid and periocular area, initial encounter: Secondary | ICD-10-CM | POA: Diagnosis not present

## 2018-01-30 MED ORDER — NAPROXEN 375 MG PO TABS: 375.0000 mg | ORAL_TABLET | Freq: Two times a day (BID) | ORAL | 0 refills | Status: DC
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
## 2018-01-30 NOTE — Discharge Instructions (Addendum)
Take naproxen 2 times a day with meals.  Do not take other anti-inflammatories at the same time open (Advil, Motrin, ibuprofen, Aleve). You may supplement with Tylenol if you need further pain control. Use ice packs, 20 minutes at a time, as frequently as possible.  Follow up with the ophthalmologist as needed.  Return to the ER if you develop vision loss, inability to move your eye, or any new or concerning symptoms.
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
## 2018-01-30 NOTE — ED Triage Notes (Signed)
Patient was registered. Per registration the patient became upset and walked out without being triaged

## 2019-03-13 ENCOUNTER — Emergency Department (HOSPITAL_BASED_OUTPATIENT_CLINIC_OR_DEPARTMENT_OTHER)
Admission: EM | Admit: 2019-03-13 | Discharge: 2019-03-13 | Disposition: A | Discharge status: Home / Self Care | Payer: Medicaid Other | Attending: Emergency Medicine | Admitting: Emergency Medicine

## 2019-03-13 ENCOUNTER — Other Ambulatory Visit: Payer: Self-pay

## 2019-03-13 ENCOUNTER — Encounter (HOSPITAL_BASED_OUTPATIENT_CLINIC_OR_DEPARTMENT_OTHER): Payer: Self-pay | Admitting: Emergency Medicine

## 2019-03-13 DIAGNOSIS — R040 Epistaxis: Secondary | ICD-10-CM | POA: Diagnosis not present

## 2019-03-13 DIAGNOSIS — F17210 Nicotine dependence, cigarettes, uncomplicated: Secondary | ICD-10-CM | POA: Insufficient documentation

## 2019-03-13 MED ORDER — OXYMETAZOLINE HCL 0.05 % NA SOLN
1.0000 | Freq: Once | NASAL | Status: AC
  Administered 2019-03-13: 21:00:00 1 via NASAL
  Filled 2019-03-13: qty 30

## 2019-03-13 NOTE — Discharge Instructions (Signed)
Use Afrin nasal spray, 1 spray in each nostril twice daily for the next few days.  If bleeding resumes, hold direct pressure for 15 minutes and leaning her head forward.  If this does not improve the bleeding, return to the ER to be reevaluated.  Follow-up with ENT if symptoms or not improving in the next few days.  Contact information for Dr. Suszanne Conners has been provided in this discharge summary for you to call and make these arrangements.

## 2019-03-13 NOTE — ED Triage Notes (Signed)
Intermittent nose bleed x4 days. Denies pain, injury, trauma, drug use. Hypertensive in triage

## 2019-03-13 NOTE — ED Provider Notes (Signed)
[facility] EMERGENCY DEPARTMENT Provider Note   CSN: [id number] Arrival date & time: 03/13/19  2009    History   Chief Complaint Chief Complaint  Patient presents with  . Epistaxis    hypertensive    HPI Jeffrey Newman is a 28 y.o. male.     Patient is a 28 year old male with no significant past medical history.  He presents today with complaints of nosebleed.  He reports intermittent bleeding from his nose for the past 3 days.  This began in the absence of any injury or trauma.  It seems to resolve with direct pressure, however has occurred several times and patient is concerned.  He denies any intranasal drug use.  He denies any other issues.  The history is provided by the patient.  Epistaxis  Location:  R nare Severity:  Moderate Duration:  3 days Timing:  Intermittent Progression:  Unchanged Chronicity:  New Context: not anticoagulants   Relieved by:  Nothing Worsened by:  Nothing Ineffective treatments:  None tried   History reviewed. No pertinent past medical history.  There are no active problems to display for this patient.   Past Surgical History:  Procedure Laterality Date  . FRACTURE SURGERY          Home Medications    Prior to Admission medications   Medication Sig Start Date End Date Taking? Authorizing Provider  cyclobenzaprine (FLEXERIL) 10 MG tablet Take 1 tablet (10 mg total) by mouth 2 (two) times daily as needed for muscle spasms. 12/13/16   Rise Mu, PA-C  naproxen (NAPROSYN) 375 MG tablet Take 1 tablet (375 mg total) by mouth 2 (two) times daily with a meal. 01/30/18   Caccavale, Sophia, PA-C  pseudoephedrine (SUDAFED) 30 MG/5ML syrup Take 5 mLs (30 mg total) by mouth every 8 (eight) hours as needed for congestion. 06/17/17   Derwood Kaplan, MD    Family History No family history on file.  Social History Social History   Tobacco Use  . Smoking status: Current Every Day Smoker    Packs/day: 0.50    Types:  Cigarettes  . Smokeless tobacco: Never Used  Substance Use Topics  . Alcohol use: Yes    Comment: bottle of patron a week  . Drug use: No     Allergies   Patient has no known allergies.   Review of Systems Review of Systems  HENT: Positive for nosebleeds.   All other systems reviewed and are negative.    Physical Exam Updated Vital Signs BP (!) 164/110 (BP Location: Right Arm)   Pulse 88   Temp 98.4 F (36.9 C) (Oral)   Resp 16   SpO2 100%   Physical Exam Vitals signs and nursing note reviewed.  Constitutional:      Appearance: Normal appearance.  HENT:     Head: Normocephalic and atraumatic.     Nose: Nose normal.     Comments: There is dried blood in the right nares, however no active bleeding.  Septum is midline with no hematoma. Pulmonary:     Effort: Pulmonary effort is normal.  Skin:    General: Skin is warm and dry.  Neurological:     Mental Status: He is alert.      ED Treatments / Results  Labs (all labs ordered are listed, but only abnormal results are displayed) Labs Reviewed - No data to display  EKG None  Radiology No results found.  Procedures Procedures (including critical care time)  Medications Ordered in  ED Medications  oxymetazoline (AFRIN) 0.05 % nasal spray 1 spray (has no administration in time range)     Initial Impression / Assessment and Plan / ED Course  I have reviewed the triage vital signs and the nursing notes.  Pertinent labs & imaging results that were available during my care of the patient were reviewed by me and considered in my medical decision making (see chart for details).  Patient will be given Afrin spray and advised to use this twice daily for the next few days.  He is to return as needed if bleeding resumes and he is unable to stop this with direct pressure.  Final Clinical Impressions(s) / ED Diagnoses   Final diagnoses:  None    ED Discharge Orders    None       Geoffery Lyonselo, Flor Whitacre, MD  03/13/19 2028

## 2019-08-02 ENCOUNTER — Emergency Department (HOSPITAL_BASED_OUTPATIENT_CLINIC_OR_DEPARTMENT_OTHER)
Admission: EM | Admit: 2019-08-02 | Discharge: 2019-08-02 | Disposition: A | Discharge status: Home / Self Care | Payer: Medicaid Other | Attending: Emergency Medicine | Admitting: Emergency Medicine

## 2019-08-02 ENCOUNTER — Other Ambulatory Visit: Payer: Self-pay

## 2019-08-02 DIAGNOSIS — H6123 Impacted cerumen, bilateral: Secondary | ICD-10-CM | POA: Diagnosis not present

## 2019-08-02 DIAGNOSIS — F17210 Nicotine dependence, cigarettes, uncomplicated: Secondary | ICD-10-CM | POA: Diagnosis not present

## 2019-08-02 DIAGNOSIS — H9192 Unspecified hearing loss, left ear: Secondary | ICD-10-CM | POA: Diagnosis present

## 2019-08-02 MED ORDER — CARBAMIDE PEROXIDE 6.5 % OT SOLN: 5.0000 [drp] | Freq: Two times a day (BID) | OTIC | 0 refills | Status: AC

## 2019-08-02 MED ORDER — CIPROFLOXACIN-DEXAMETHASONE 0.3-0.1 % OT SUSP: 4.0000 [drp] | Freq: Two times a day (BID) | OTIC | 0 refills | Status: DC
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
## 2019-08-02 NOTE — ED Triage Notes (Signed)
Pt states he cannot hear out of his left ear for several days.  No known fevers.

## 2020-05-05 ENCOUNTER — Encounter (HOSPITAL_BASED_OUTPATIENT_CLINIC_OR_DEPARTMENT_OTHER): Payer: Self-pay

## 2020-05-05 ENCOUNTER — Other Ambulatory Visit: Payer: Self-pay

## 2020-05-05 ENCOUNTER — Emergency Department (HOSPITAL_BASED_OUTPATIENT_CLINIC_OR_DEPARTMENT_OTHER)
Admission: EM | Admit: 2020-05-05 | Discharge: 2020-05-05 | Disposition: A | Discharge status: Home / Self Care | Payer: Medicaid Other | Attending: Emergency Medicine | Admitting: Emergency Medicine

## 2020-05-05 DIAGNOSIS — F17210 Nicotine dependence, cigarettes, uncomplicated: Secondary | ICD-10-CM | POA: Diagnosis not present

## 2020-05-05 DIAGNOSIS — Z20822 Contact with and (suspected) exposure to covid-19: Secondary | ICD-10-CM | POA: Insufficient documentation

## 2020-05-05 DIAGNOSIS — J069 Acute upper respiratory infection, unspecified: Secondary | ICD-10-CM | POA: Diagnosis not present

## 2020-05-05 DIAGNOSIS — R05 Cough: Secondary | ICD-10-CM | POA: Diagnosis present

## 2020-05-05 LAB — SARS CORONAVIRUS 2 BY RT PCR (HOSPITAL ORDER, PERFORMED IN ~~LOC~~ HOSPITAL LAB): SARS Coronavirus 2: NEGATIVE

## 2020-05-05 NOTE — Discharge Instructions (Addendum)
Please read the attachment on upper respiratory infections.  You likely have a viral illness.  Please maintain isolation precautions pending results of your COVID-19 testing.  Please continue to eat regular meals and drink plenty of fluids.  Check your temperature regularly and take Tylenol or ibuprofen should you develop any fevers or chills.  I recommend that you obtain Mucinex or Delsym over-the-counter pharmacy to take for your symptoms of cough.    Return to the ED or seek immediate medical attention should experience any new or worsening symptoms.

## 2020-05-05 NOTE — ED Triage Notes (Signed)
Pt c/o nasal congestion and cough x 3 days. Denies fevers or ShOB. Pt wants to know if he has COVID, denies sick contacts.

## 2020-05-05 NOTE — ED Provider Notes (Signed)
[facility] EMERGENCY DEPARTMENT Provider Note   CSN: [id number] Arrival date & time: 05/05/20  1412     History Chief Complaint  Patient presents with  . URI    Jeffrey Newman is a 29 y.o. male with no significant past medical history presents the ED with a 3-day history of nasal congestion, rhinorrhea, diminished appetite, and cough.  Patient lives at home with his parents who are both vaccinated for COVID-19.  He however has not yet been vaccinated.  He is concerned that he may have contracted COVID-19 and is requesting testing.  He denies any fevers or chills, headache, sore throat, chest pain or shortness of breath, difficulty breathing or swallowing, abdominal pain, nausea or vomiting, urinary symptoms, or changes in bowel habits.  He reports that his smell is gone, but suspects that it could be due to his nasal congestion.  He has not taken anything for his symptoms.  HPI     History reviewed. No pertinent past medical history.  There are no problems to display for this patient.   Past Surgical History:  Procedure Laterality Date  . FRACTURE SURGERY         No family history on file.  Social History   Tobacco Use  . Smoking status: Current Every Day Smoker    Packs/day: 0.50    Types: Cigarettes  . Smokeless tobacco: Never Used  Vaping Use  . Vaping Use: Never used  Substance Use Topics  . Alcohol use: Yes    Comment: bottle of patron a week  . Drug use: No    Home Medications Prior to Admission medications   Medication Sig Start Date End Date Taking? Authorizing Provider  ciprofloxacin-dexamethasone (CIPRODEX) OTIC suspension Place 4 drops into both ears 2 (two) times daily. 08/02/19   Arlyn Dunning, PA-C  cyclobenzaprine (FLEXERIL) 10 MG tablet Take 1 tablet (10 mg total) by mouth 2 (two) times daily as needed for muscle spasms. 12/13/16   Rise Mu, PA-C  naproxen (NAPROSYN) 375 MG tablet Take 1 tablet (375 mg total) by mouth 2 (two)  times daily with a meal. 01/30/18   Caccavale, Sophia, PA-C  pseudoephedrine (SUDAFED) 30 MG/5ML syrup Take 5 mLs (30 mg total) by mouth every 8 (eight) hours as needed for congestion. 06/17/17   Derwood Kaplan, MD    Allergies    Patient has no known allergies.  Review of Systems   Review of Systems  Constitutional: Positive for appetite change. Negative for chills and fever.  Respiratory: Positive for cough. Negative for shortness of breath.   Cardiovascular: Negative for chest pain and palpitations.  Gastrointestinal: Negative for abdominal pain and nausea.    Physical Exam Updated Vital Signs BP (!) 144/111 (BP Location: Left Arm)   Pulse 75   Temp 98 F (36.7 C) (Oral)   Resp 20   Ht 6' (1.829 m)   Wt 71.2 kg   SpO2 100%   BMI 21.29 kg/m   Physical Exam Vitals and nursing note reviewed. Exam conducted with a chaperone present.  Constitutional:      General: He is not in acute distress.    Appearance: Normal appearance. He is not ill-appearing.  HENT:     Head: Normocephalic and atraumatic.  Eyes:     General: No scleral icterus.    Conjunctiva/sclera: Conjunctivae normal.  Cardiovascular:     Rate and Rhythm: Normal rate and regular rhythm.     Pulses: Normal pulses.     Heart  sounds: Normal heart sounds.  Pulmonary:     Comments: No increased work of breathing.  No respiratory distress.  Breath sounds intact bilaterally.  No abnormal breath sounds appreciated. Abdominal:     General: Abdomen is flat. There is no distension.     Palpations: Abdomen is soft.     Tenderness: There is no abdominal tenderness.  Skin:    General: Skin is dry.     Capillary Refill: Capillary refill takes less than 2 seconds.  Neurological:     Mental Status: He is alert and oriented to person, place, and time.     GCS: GCS eye subscore is 4. GCS verbal subscore is 5. GCS motor subscore is 6.  Psychiatric:        Mood and Affect: Mood normal.        Behavior: Behavior normal.          Thought Content: Thought content normal.      ED Results / Procedures / Treatments   Labs (all labs ordered are listed, but only abnormal results are displayed) Labs Reviewed  SARS CORONAVIRUS 2 BY RT PCR (HOSPITAL ORDER, PERFORMED IN [facility])    EKG None  Radiology No results found.  Procedures Procedures (including critical care time)  Medications Ordered in ED Medications - No data to display  ED Course  I have reviewed the triage vital signs and the nursing notes.  Pertinent labs & imaging results that were available during my care of the patient were reviewed by me and considered in my medical decision making (see chart for details).    MDM Rules/Calculators/A&P                          Patient denies any history of seasonal allergies.  He is 3-day history of rhinorrhea, nasal congestion, diminished appetite, and nonproductive cough is likely suggestive of an upper respiratory infection.  I suspect viral etiology.  Patient to be tested for COVID-19.  Isolation precautions while test results pending.  He is in no acute distress and denies any chest pain, difficulty breathing, shortness of breath, fevers or chills, or other more concerning symptoms.  Do not feel as though laboratory work-up or imaging would yield any significant findings.  Patient is resting comfortably in no acute distress.  His vital signs have been stable and WNL aside from mildly elevated BP.  Discussed conservative management.  Patient and/or family were informed that while patient is appropriate for discharge at this time, some medical emergencies may only develop or become detectable after a period of time.  I specifically instructed patient and/or family to return to return to the ED or seek immediate medical attention for any new or worsening symptoms.  They were provided opportunity to ask any additional questions and have none at this time.  Prior to discharge patient is feeling  well, agreeable with plan for discharge home.  They have expressed understanding of verbal discharge instructions as well as return precautions and are agreeable to the plan.   Jeffrey Newman was evaluated in Emergency Department on 05/05/2020 for the symptoms described in the history of present illness. He was evaluated in the context of the global COVID-19 pandemic, which necessitated consideration that the patient might be at risk for infection with the SARS-CoV-2 virus that causes COVID-19. Institutional protocols and algorithms that pertain to the evaluation of patients at risk for COVID-19 are in a state of rapid change based  on information released by regulatory bodies including the CDC and federal and state organizations. These policies and algorithms were followed during the patient's care in the ED.   Final Clinical Impression(s) / ED Diagnoses Final diagnoses:  Viral URI with cough    Rx / DC Orders ED Discharge Orders    None       Lorelee New, PA-C 05/05/20 1536    Alvira Monday, MD 05/07/20 2316

## 2020-09-20 ENCOUNTER — Other Ambulatory Visit: Payer: Self-pay

## 2020-09-20 ENCOUNTER — Emergency Department (HOSPITAL_BASED_OUTPATIENT_CLINIC_OR_DEPARTMENT_OTHER)
Admission: EM | Admit: 2020-09-20 | Discharge: 2020-09-21 | Disposition: A | Discharge status: Home / Self Care | Payer: Medicaid Other | Attending: Emergency Medicine | Admitting: Emergency Medicine

## 2020-09-20 DIAGNOSIS — K649 Unspecified hemorrhoids: Secondary | ICD-10-CM

## 2020-09-20 DIAGNOSIS — F17210 Nicotine dependence, cigarettes, uncomplicated: Secondary | ICD-10-CM | POA: Insufficient documentation

## 2020-09-21 ENCOUNTER — Encounter (HOSPITAL_BASED_OUTPATIENT_CLINIC_OR_DEPARTMENT_OTHER): Payer: Self-pay | Admitting: *Deleted

## 2020-09-21 ENCOUNTER — Other Ambulatory Visit: Payer: Self-pay

## 2020-09-21 MED ORDER — HYDROCORTISONE ACE-PRAMOXINE 1-1 % EX CREA: 1.0000 "application " | TOPICAL_CREAM | Freq: Two times a day (BID) | CUTANEOUS | 0 refills | Status: AC

## 2020-09-21 MED ORDER — DOCUSATE SODIUM 100 MG PO CAPS: 100.0000 mg | ORAL_CAPSULE | Freq: Two times a day (BID) | ORAL | 0 refills | Status: AC

## 2020-09-21 NOTE — ED Provider Notes (Signed)
[facility] EMERGENCY DEPARTMENT Provider Note   CSN: [id number] Arrival date & time: 09/20/20  2356     History Chief Complaint  Patient presents with  . Hemorrhoids    Jeffrey Newman is a 29 y.o. male.  The history is provided by the patient.  Patient reports that he thinks he has a hemorrhoid. He reports this started over the past day. He reports it is worsening, worse with walking and sitting. No rectal bleeding. No fevers or vomiting. No Abdominal pain. He has never had this before. He has no other acute complaints      PMH-asthma Past Surgical History:  Procedure Laterality Date  . FRACTURE SURGERY         No family history on file.  Social History   Tobacco Use  . Smoking status: Current Every Day Smoker    Packs/day: 0.50    Types: Cigarettes  . Smokeless tobacco: Never Used  Vaping Use  . Vaping Use: Never used  Substance Use Topics  . Alcohol use: Yes    Comment: bottle of patron a week  . Drug use: No    Home Medications Prior to Admission medications   Medication Sig Start Date End Date Taking? Authorizing Provider  docusate sodium (COLACE) 100 MG capsule Take 1 capsule (100 mg total) by mouth every 12 (twelve) hours. 09/21/20   Zadie Rhine, MD  pramoxine-hydrocortisone (PROCTOCREAM-HC) 1-1 % rectal cream Place 1 application rectally 2 (two) times daily. 09/21/20   Zadie Rhine, MD    Allergies    Patient has no known allergies.  Review of Systems   Review of Systems  Constitutional: Negative for fever.  Gastrointestinal: Positive for rectal pain. Negative for abdominal pain, blood in stool, constipation and vomiting.  Genitourinary: Negative for dysuria.  All other systems reviewed and are negative.   Physical Exam Updated Vital Signs BP (!) 147/105 (BP Location: Right Arm)   Pulse 66   Temp 98.7 F (37.1 C) (Oral)   Resp 16   Ht 1.753 m (5\' 9" )   Wt 68 kg   SpO2 100%   BMI 22.15 kg/m   Physical  Exam CONSTITUTIONAL: Well developed/well nourished HEAD: Normocephalic/atraumatic EYES: EOMI NECK: supple no meningeal signs SPINE/BACK:entire spine nontender CV: S1/S2 noted, no murmurs/rubs/gallops noted LUNGS: Lungs are clear to auscultation bilaterally, no apparent distress ABDOMEN: soft, nontender, no rebound or guarding, bowel sounds noted throughout abdomen Rectal-large external hemorrhoid that is not thrombosed. No erythema. No crepitus. No abscesses noted. There is no blood/melena. Nurse present for exam NEURO: Pt is awake/alert/appropriate, moves all extremitiesx4.  No facial droop.   EXTREMITIES: full ROM SKIN: warm, color normal PSYCH: no abnormalities of mood noted, alert and oriented to situation  ED Results / Procedures / Treatments   Labs (all labs ordered are listed, but only abnormal results are displayed) Labs Reviewed - No data to display  EKG None  Radiology No results found.  Procedures Procedures  Medications Ordered in ED Medications - No data to display  ED Course  I have reviewed the triage vital signs and the nursing notes.      MDM Rules/Calculators/A&P                          Found to have a large nonthrombosed external hemorrhoid. Advised to increase fiber intake and PO fluids Will prescribe Colace and local therapy for the pain  Final Clinical Impression(s) / ED Diagnoses Final diagnoses:  Hemorrhoids, unspecified hemorrhoid type    Rx / DC Orders ED Discharge Orders         Ordered    docusate sodium (COLACE) 100 MG capsule  Every 12 hours        09/21/20 0024    pramoxine-hydrocortisone (PROCTOCREAM-HC) 1-1 % rectal cream  2 times daily        09/21/20 0024           Zadie Rhine, MD 09/21/20 [phone]

## 2020-09-21 NOTE — ED Triage Notes (Signed)
Pt c/o pain from hemorrhoids that started today.

## 2021-01-18 ENCOUNTER — Emergency Department (HOSPITAL_BASED_OUTPATIENT_CLINIC_OR_DEPARTMENT_OTHER)
Admission: EM | Admit: 2021-01-18 | Discharge: 2021-01-18 | Disposition: A | Discharge status: Home / Self Care | Payer: Medicaid Other | Attending: Emergency Medicine | Admitting: Emergency Medicine

## 2021-01-18 ENCOUNTER — Emergency Department (HOSPITAL_BASED_OUTPATIENT_CLINIC_OR_DEPARTMENT_OTHER): Payer: Medicaid Other

## 2021-01-18 ENCOUNTER — Encounter (HOSPITAL_BASED_OUTPATIENT_CLINIC_OR_DEPARTMENT_OTHER): Payer: Self-pay | Admitting: *Deleted

## 2021-01-18 ENCOUNTER — Other Ambulatory Visit: Payer: Self-pay

## 2021-01-18 DIAGNOSIS — R079 Chest pain, unspecified: Secondary | ICD-10-CM | POA: Diagnosis present

## 2021-01-18 DIAGNOSIS — F17210 Nicotine dependence, cigarettes, uncomplicated: Secondary | ICD-10-CM | POA: Diagnosis not present

## 2021-01-18 DIAGNOSIS — R0789 Other chest pain: Secondary | ICD-10-CM | POA: Diagnosis not present

## 2021-01-18 DIAGNOSIS — R059 Cough, unspecified: Secondary | ICD-10-CM | POA: Diagnosis not present

## 2021-01-18 MED ORDER — CYCLOBENZAPRINE HCL 10 MG PO TABS: 10.0000 mg | ORAL_TABLET | Freq: Three times a day (TID) | ORAL | 0 refills | Status: AC

## 2021-01-18 NOTE — ED Provider Notes (Signed)
[facility] EMERGENCY DEPARTMENT Provider Note   CSN: [id number] Arrival date & time: 01/18/21  1638     History Chief Complaint  Patient presents with  . Chest Pain    Jeffrey Newman is a 30 y.o. male.  30 y.o male with no PMH presents to the ED with a chief complaint chest pain since yesterday.  Patient reports he was at the correct, washing his car when he suddenly began to feel a tugging sensation to the left chest without any radiation.  He reports the pain is exacerbated with reaching of his neck, movement.  He has not taken any medication for improvement in his symptoms.  He is currently a daily smoker, reports he smokes "a little less than a pack a day". Per girlfriend at the bedside he is also had a non productive cough for the past 2 days.  Does have a family history of CAD, with grandfather who had an MI around his 6s or 72s.  He denies any CAD, headache, dizziness, weakness.  No recent fevers or sick contacts.    The history is provided by the patient.  Chest Pain Pain location:  L chest Pain quality: dull   Pain radiates to:  L jaw Timing:  Intermittent Progression:  Unchanged Chronicity:  New Context: movement   Context: not drug use, not lifting and not raising an arm   Relieved by:  Nothing Worsened by:  Certain positions Ineffective treatments:  None tried Associated symptoms: cough   Associated symptoms: no abdominal pain, no anxiety, no dizziness, no fever, no lower extremity edema, no palpitations, no shortness of breath and no weakness   Risk factors: male sex   Risk factors: no coronary artery disease, no diabetes mellitus, no high cholesterol, no hypertension and no prior DVT/PE         Past Surgical History:  Procedure Laterality Date  . FRACTURE SURGERY         No family history on file.  Social History   Tobacco Use  . Smoking status: Current Every Day Smoker    Packs/day: 0.50    Types: Cigarettes  . Smokeless tobacco:  Never Used  Vaping Use  . Vaping Use: Never used  Substance Use Topics  . Alcohol use: Yes    Comment: bottle of patron a week  . Drug use: No    Home Medications Prior to Admission medications   Medication Sig Start Date End Date Taking? Authorizing Provider  cyclobenzaprine (FLEXERIL) 10 MG tablet Take 1 tablet (10 mg total) by mouth 3 (three) times daily for 7 days. 01/18/21 01/25/21 Yes Frady Taddeo, Leonie Douglas, PA-C  docusate sodium (COLACE) 100 MG capsule Take 1 capsule (100 mg total) by mouth every 12 (twelve) hours. 09/21/20   Zadie Rhine, MD  pramoxine-hydrocortisone (PROCTOCREAM-HC) 1-1 % rectal cream Place 1 application rectally 2 (two) times daily. 09/21/20   Zadie Rhine, MD    Allergies    Patient has no known allergies.  Review of Systems   Review of Systems  Constitutional: Negative for fever.  Respiratory: Positive for cough. Negative for shortness of breath.   Cardiovascular: Positive for chest pain. Negative for palpitations.  Gastrointestinal: Negative for abdominal pain.  Neurological: Negative for dizziness and weakness.  All other systems reviewed and are negative.   Physical Exam Updated Vital Signs BP (!) 143/102 (BP Location: Right Arm)   Pulse 60   Temp 98.1 F (36.7 C) (Oral)   Resp 16   Ht 5\' 9"  (1.753  m)   Wt 73.2 kg   SpO2 100%   BMI 23.83 kg/m   Physical Exam Vitals and nursing note reviewed.  Constitutional:      Appearance: He is well-developed.  HENT:     Head: Normocephalic and atraumatic.  Cardiovascular:     Rate and Rhythm: Normal rate.  Pulmonary:     Effort: Pulmonary effort is normal.     Breath sounds: No decreased breath sounds, wheezing or rhonchi.  Chest:     Chest wall: Tenderness present.    Musculoskeletal:     Right lower leg: No tenderness. No edema.     Left lower leg: No tenderness. No edema.  Skin:    General: Skin is warm and dry.  Neurological:     Mental Status: He is alert and oriented to person,  place, and time.     ED Results / Procedures / Treatments   Labs (all labs ordered are listed, but only abnormal results are displayed) Labs Reviewed - No data to display  EKG EKG Interpretation  Date/Time:  Friday January 18 2021 16:46:32 EDT Ventricular Rate:  83 PR Interval:  170 QRS Duration: 84 QT Interval:  330 QTC Calculation: 387 R Axis:   107 Text Interpretation: Normal sinus rhythm with sinus arrhythmia Rightward axis Septal infarct , age undetermined Abnormal ECG NO prior ECG for comparison. No STEMI Confirmed by Theda Belfast ([id number]) on 01/18/2021 5:51:23 PM   Radiology DG Chest 2 View  Result Date: 01/18/2021 CLINICAL DATA:  Acute onset upper chest pain yesterday. EXAM: CHEST - 2 VIEW COMPARISON:  None. FINDINGS: Lungs clear. Heart size normal. No pneumothorax or pleural fluid. No bony abnormality. IMPRESSION: Negative chest. Electronically Signed   By: Drusilla Kanner M.D.   On: 01/18/2021 17:58    Procedures Procedures   Medications Ordered in ED Medications - No data to display  ED Course  I have reviewed the triage vital signs and the nursing notes.  Pertinent labs & imaging results that were available during my care of the patient were reviewed by me and considered in my medical decision making (see chart for details).    MDM Rules/Calculators/A&P  Patient with apparent episode patient presents to ED with a chief complaint of chest pain which began yesterday after washing his car.  Pain changes with ranging of his neck up and down side to side.  He has not take any medication for improvement in symptoms.  He also endorses a nonproductive cough, has not had any fevers.  During arrival patient is hypertensive, no prior history of hypertension, he is afebrile.  Oxygen saturation is 100% on room air.  The well-appearing, the rest of his exam is benign.  He does have some present to the left lower lobe, abdomen is soft nontender to palpation.  There is no pain  to his left chest is reproducible with palpation.  No bilateral leg swelling or edema.  EKG without any changes, no QT prolongation.  Shared decision making with patient who is fully moving his neck at this time, we discussed likely musculoskeletal in nature.  We discussed treatment with muscle relaxers to help with symptoms.  His heart score is 1-2, does not have other risk factors or prior history of CAD.  Vitals are within normal limits aside from slightly hypertensive, without any tachycardia.  Return precautions were discussed at length.  Patient stable for discharge.  Portions of this note were generated with Scientist, clinical (histocompatibility and immunogenetics). Dictation errors may occur despite  best attempts at proofreading.  Final Clinical Impression(s) / ED Diagnoses Final diagnoses:  Atypical chest pain    Rx / DC Orders ED Discharge Orders         Ordered    cyclobenzaprine (FLEXERIL) 10 MG tablet  3 times daily        01/18/21 1813           Claude Manges, PA-C 01/18/21 1848    Tegeler, Canary Brim, MD 01/18/21 2357

## 2021-01-18 NOTE — ED Triage Notes (Signed)
Left sided chest pain yesterday while washing his car. Today he can reproduce the pain if he turns his head to the left. Ambulatory. EKG at triage.

## 2021-01-18 NOTE — Discharge Instructions (Addendum)
We discussed the results of your x-ray along with EKG.  I have prescribed a short course of muscle relaxers to help with likely a muscular strain, please take 1 tablet up to 3 times a day for the next 7 days.  If you experience any worsening symptoms, shortness of breath please return to the emergency department immediately.

## 2022-09-18 IMAGING — CR DG CHEST 2V
2 series · 2 of 2 positions shown · non-contrast
Comparison: None.

CLINICAL DATA: Acute onset upper chest pain yesterday.

EXAM:
CHEST - 2 VIEW

[w chest pa]
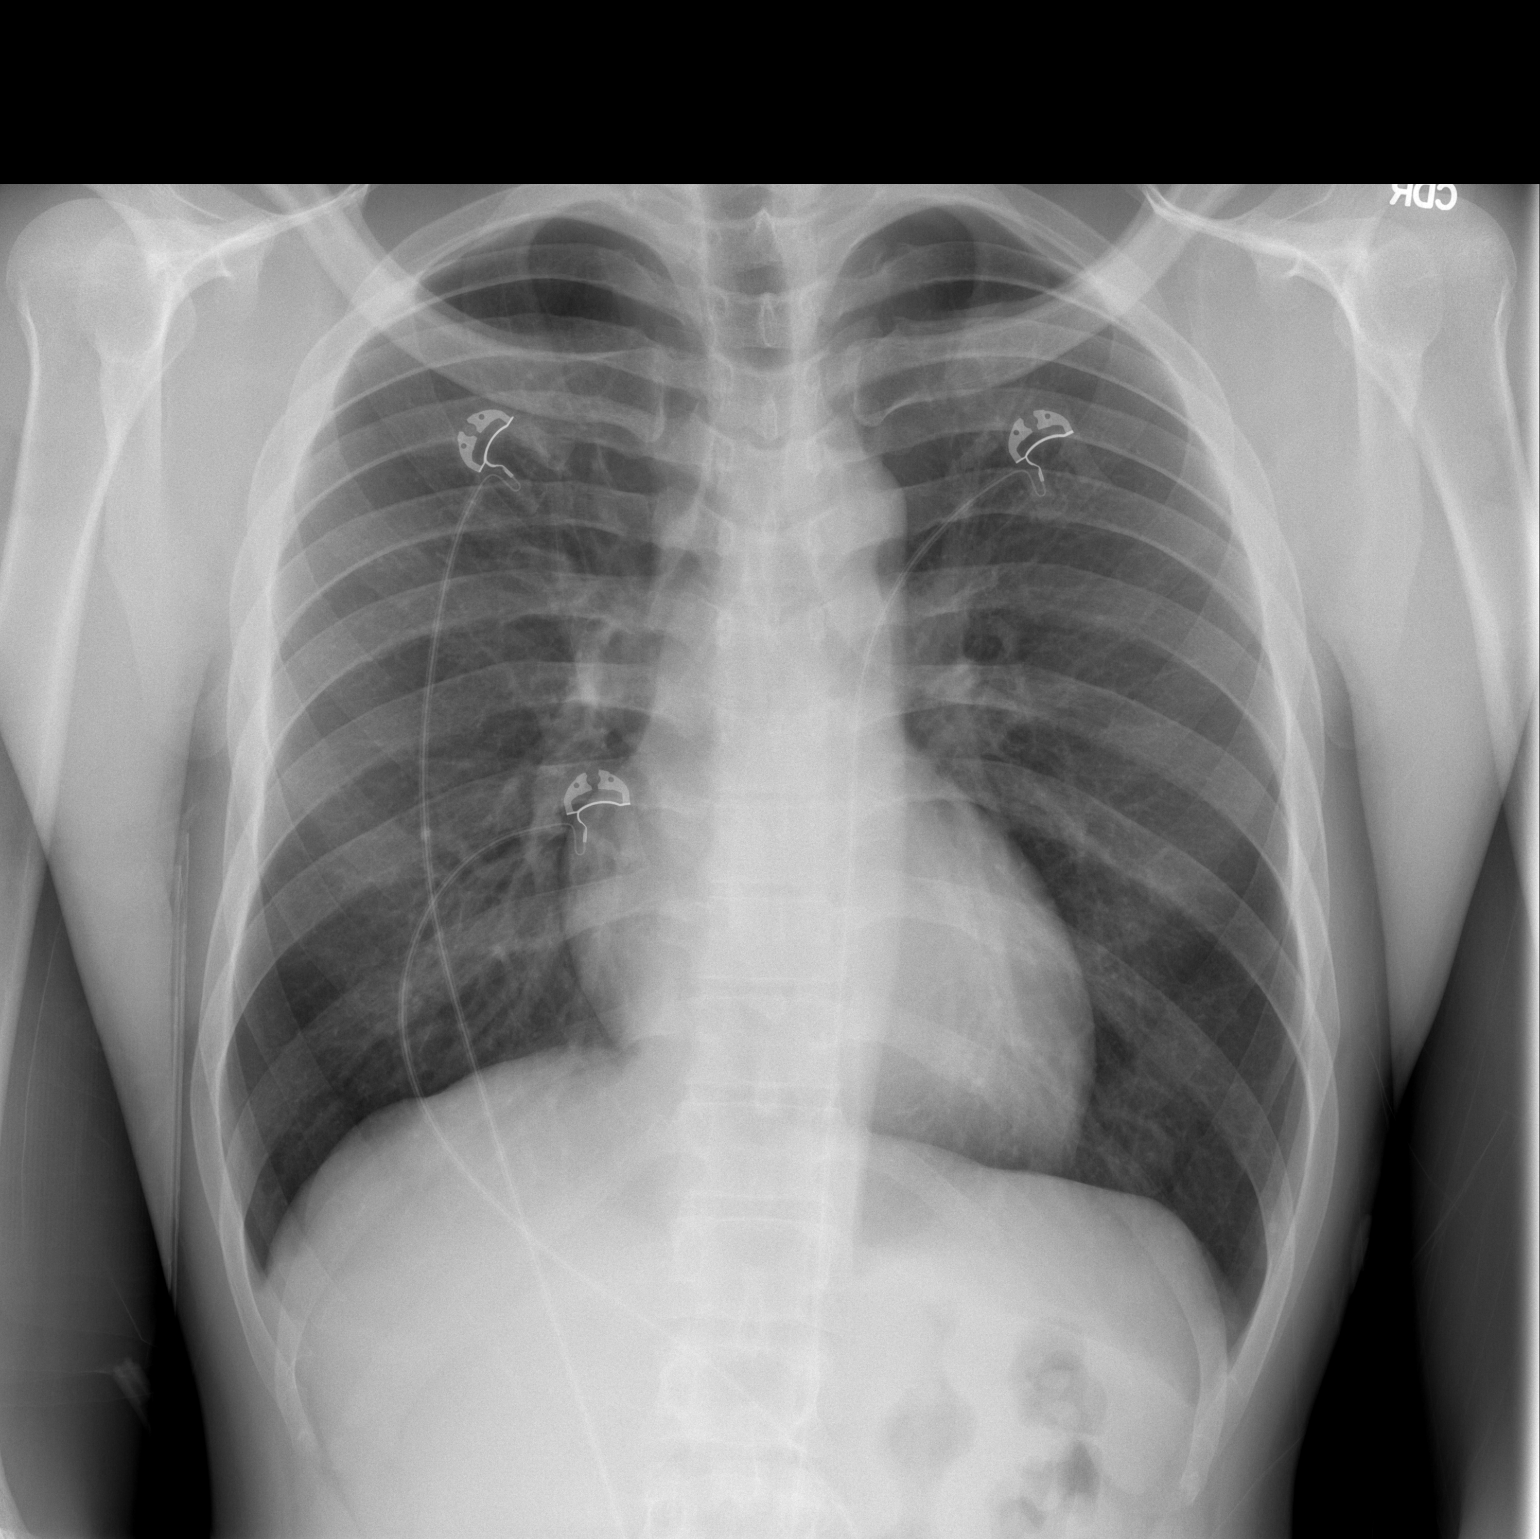

[w chest lat]
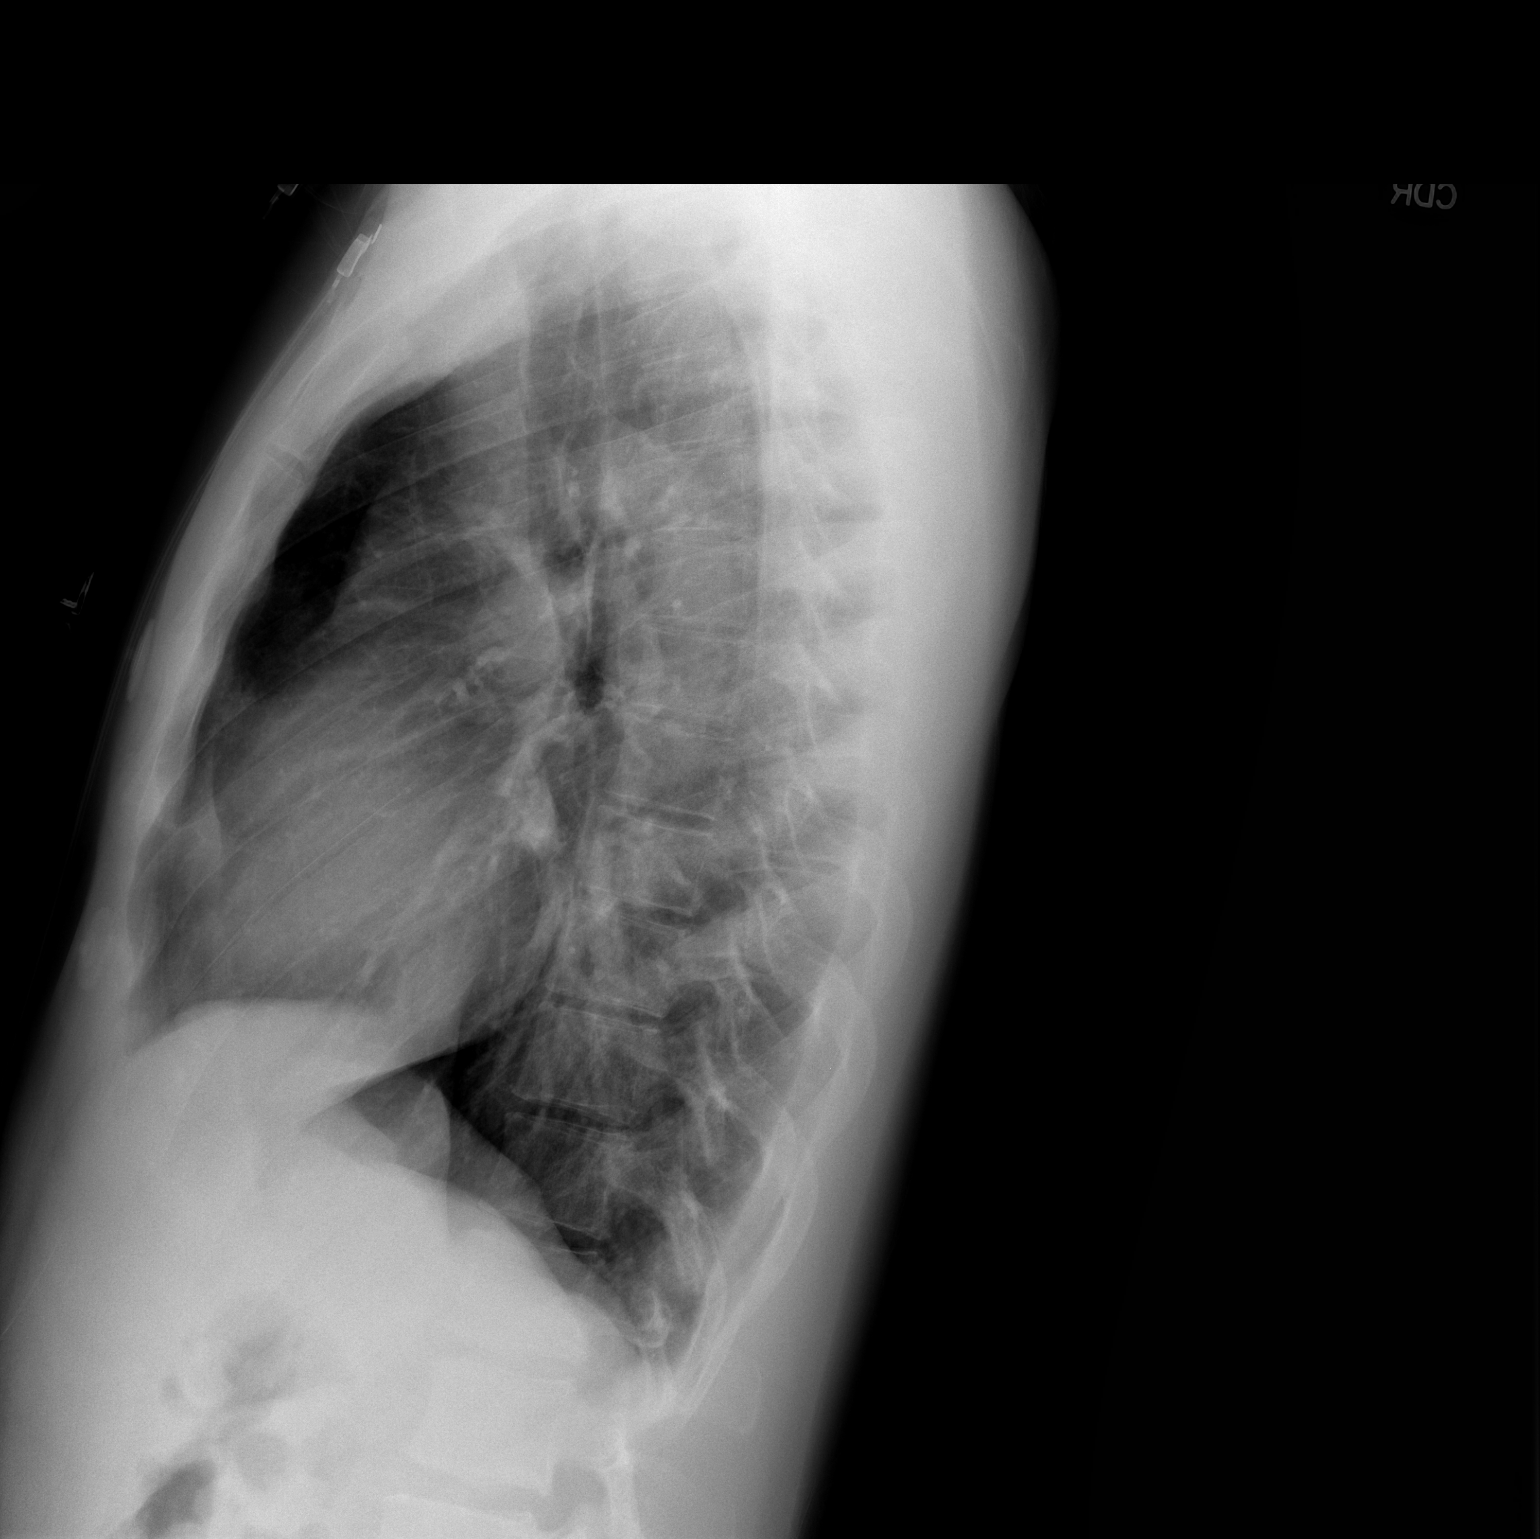

[2 of 2 positions shown; findings below may reference images not displayed]

FINDINGS: Lungs clear. Heart size normal. No pneumothorax or pleural fluid. No
bony abnormality.
IMPRESSION: Negative chest.

## 2022-12-02 ENCOUNTER — Other Ambulatory Visit: Payer: Self-pay

## 2022-12-02 ENCOUNTER — Emergency Department (HOSPITAL_BASED_OUTPATIENT_CLINIC_OR_DEPARTMENT_OTHER)
Admission: EM | Admit: 2022-12-02 | Discharge: 2022-12-02 | Disposition: A | Discharge status: Home / Self Care | Payer: Medicaid Other | Attending: Emergency Medicine | Admitting: Emergency Medicine

## 2022-12-02 DIAGNOSIS — R197 Diarrhea, unspecified: Secondary | ICD-10-CM | POA: Diagnosis present

## 2022-12-02 DIAGNOSIS — Z20822 Contact with and (suspected) exposure to covid-19: Secondary | ICD-10-CM | POA: Diagnosis not present

## 2022-12-02 DIAGNOSIS — K529 Noninfective gastroenteritis and colitis, unspecified: Secondary | ICD-10-CM | POA: Diagnosis not present

## 2022-12-02 LAB — CBC
HCT: 46.8 % (ref 39.0–52.0)
Hemoglobin: 15.5 g/dL (ref 13.0–17.0)
MCH: 27.7 pg (ref 26.0–34.0)
MCHC: 33.1 g/dL (ref 30.0–36.0)
MCV: 83.7 fL (ref 80.0–100.0)
Platelets: 215 10*3/uL (ref 150–400)
RBC: 5.59 MIL/uL (ref 4.22–5.81)
RDW: 12.8 % (ref 11.5–15.5)
WBC: 3.4 10*3/uL — ABNORMAL LOW (ref 4.0–10.5)
nRBC: 0 % (ref 0.0–0.2)

## 2022-12-02 LAB — COMPREHENSIVE METABOLIC PANEL
ALT: 18 U/L (ref 0–44)
AST: 25 U/L (ref 15–41)
Albumin: 4.8 g/dL (ref 3.5–5.0)
Alkaline Phosphatase: 76 U/L (ref 38–126)
Anion gap: 9 (ref 5–15)
BUN: 16 mg/dL (ref 6–20)
CO2: 23 mmol/L (ref 22–32)
Calcium: 9.1 mg/dL (ref 8.9–10.3)
Chloride: 104 mmol/L (ref 98–111)
Creatinine, Ser: 1.14 mg/dL (ref 0.61–1.24)
GFR, Estimated: >60 mL/min (ref >60)
Glucose, Bld: 89 mg/dL (ref 70–99)
Potassium: 3.5 mmol/L (ref 3.5–5.1)
Sodium: 136 mmol/L (ref 135–145)
Total Bilirubin: 0.7 mg/dL (ref 0.3–1.2)
Total Protein: 8.1 g/dL (ref 6.5–8.1)

## 2022-12-02 LAB — URINALYSIS, ROUTINE W REFLEX MICROSCOPIC
Bilirubin Urine: NEGATIVE
Glucose, UA: NEGATIVE mg/dL
Hgb urine dipstick: NEGATIVE
Ketones, ur: NEGATIVE mg/dL
Leukocytes,Ua: NEGATIVE
Nitrite: NEGATIVE
Protein, ur: NEGATIVE mg/dL
Specific Gravity, Urine: 1.025 (ref 1.005–1.030)
pH: 5.5 (ref 5.0–8.0)

## 2022-12-02 LAB — RESP PANEL BY RT-PCR (RSV, FLU A&B, COVID)  RVPGX2
Influenza A by PCR: NEGATIVE
Influenza B by PCR: NEGATIVE
Resp Syncytial Virus by PCR: NEGATIVE
SARS Coronavirus 2 by RT PCR: NEGATIVE

## 2022-12-02 LAB — LIPASE, BLOOD: Lipase: 28 U/L (ref 11–51)

## 2022-12-02 MED ORDER — ONDANSETRON HCL 4 MG PO TABS: 4.0000 mg | ORAL_TABLET | Freq: Four times a day (QID) | ORAL | 0 refills | Status: AC

## 2022-12-02 NOTE — ED Provider Notes (Signed)
Jeffrey Newman Provider Note   CSN: [id number] Arrival date & time: 12/02/22  1557     History  Chief Complaint  Patient presents with   Abdominal Pain    Jeffrey Newman is a 32 y.o. male who presents with concern for abdominal pain intermittently, weakness, 1 episode of vomiting for the last 3 days.  Patient denies significant fever, chills, upper respiratory infections.  He does endorse some diarrhea.  He does not recall any abnormal oral intake.   Abdominal Pain      Home Medications Prior to Admission medications   Medication Sig Start Date End Date Taking? Authorizing Provider  ondansetron (ZOFRAN) 4 MG tablet Take 1 tablet (4 mg total) by mouth every 6 (six) hours. 12/02/22  Yes Kaia Depaolis H, PA-C  docusate sodium (COLACE) 100 MG capsule Take 1 capsule (100 mg total) by mouth every 12 (twelve) hours. 09/21/20   Ripley Fraise, MD  pramoxine-hydrocortisone (PROCTOCREAM-HC) 1-1 % rectal cream Place 1 application rectally 2 (two) times daily. 09/21/20   Ripley Fraise, MD      Allergies    Patient has no known allergies.    Review of Systems   Review of Systems  Gastrointestinal:  Positive for abdominal pain.  All other systems reviewed and are negative.   Physical Exam Updated Vital Signs BP (!) 150/104   Pulse 69   Temp 99 F (37.2 C)   Resp 18   Wt 77.1 kg   SpO2 100%   BMI 25.10 kg/m  Physical Exam Vitals and nursing note reviewed.  Constitutional:      General: He is not in acute distress.    Appearance: Normal appearance.  HENT:     Head: Normocephalic and atraumatic.  Eyes:     General:        Right eye: No discharge.        Left eye: No discharge.  Cardiovascular:     Rate and Rhythm: Normal rate and regular rhythm.     Heart sounds: No murmur heard.    No friction rub. No gallop.  Pulmonary:     Effort: Pulmonary effort is normal.     Breath sounds: Normal breath sounds.  Abdominal:      General: Bowel sounds are normal.     Palpations: Abdomen is soft.     Comments: No significant tenderness to patient of the abdomen.  No rebound, rigidity, guarding.  Normal bowel sounds throughout.  Skin:    General: Skin is warm and dry.     Capillary Refill: Capillary refill takes less than 2 seconds.  Neurological:     Mental Status: He is alert and oriented to person, place, and time.  Psychiatric:        Mood and Affect: Mood normal.        Behavior: Behavior normal.     ED Results / Procedures / Treatments   Labs (all labs ordered are listed, but only abnormal results are displayed) Labs Reviewed  CBC - Abnormal; Notable for the following components:      Result Value   WBC 3.4 (*)    All other components within normal limits  RESP PANEL BY RT-PCR (RSV, FLU A&B, COVID)  [id number]  LIPASE, BLOOD  COMPREHENSIVE METABOLIC PANEL  URINALYSIS, ROUTINE W REFLEX MICROSCOPIC    EKG None  Radiology No results found.  Procedures Procedures    Medications Ordered in ED Medications - No data to display  ED  Course/ Medical Decision Making/ A&P                              Medical Decision Making Amount and/or Complexity of Data Reviewed Labs: ordered.  Risk Prescription drug management.   This patient is a 32 y.o. male  who presents to the ED for concern of weakness, sweating, emesis, stomach cramping since "Sunday. Endorses some diarrhea.   Differential diagnoses prior to evaluation: The emergent differential diagnosis includes, but is not limited to,  esophagitis, gastritis, peptic ulcer disease, esophageal rupture, gastric rupture, Boerhaave's, Mallory-Weiss, pancreatitis, cholecystitis, cholangitis, acute mesenteric ischemia, atypical chest pain or ACS, lower lobar pneumonia versus other . This is not an exhaustive differential.   Past Medical History / Co-morbidities: Overall noncontributory past medical history, no previous history of intra-abdominal  surgery  Physical Exam: Physical exam performed. The pertinent findings include: This is an overall well-appearing patient with no significant tenderness palpation of the abdomen.  He is in no acute distress.  Lab Tests/Imaging studies: I personally interpreted labs/imaging and the pertinent results include: CBC, CMP, lipase, UA, and RVP are unremarkable.    Disposition: After consideration of the diagnostic results and the patients response to treatment, I feel that patient symptoms are consistent with either food poisoning, gastroenteritis, gastritis, IBS, he is tolerating p.o. in the emergency department, I think that discharge on Zofran, encouraging plenty of fluids, and rest is appropriate at this time, he has no focal findings to suggest acute emergent or surgical intra-abdominal findings.   emergency department workup does not suggest an emergent condition requiring admission or immediate intervention beyond what has been performed at this time. The plan is: as above. The patient is safe for discharge and has been instructed to return immediately for worsening symptoms, change in symptoms or any other concerns.  Final Clinical Impression(s) / ED Diagnoses Final diagnoses:  Gastroenteritis    Rx / DC Orders ED Discharge Orders          Ordered    ondansetron (ZOFRAN) 4 MG tablet  Every 6 hours        01" /30/24 1756              Moana Munford, Joesph Fillers, PA-C 12/03/22 0908    Margette Fast, MD 12/08/22 [phone]

## 2022-12-02 NOTE — ED Triage Notes (Signed)
Pt presents with weakness and "sweating"  Had one episode of vomiting on Sunday.  Got off work at 61 today and took a nap and when he woke up his stomach was hurting. "Feels cold"   Does endorse diarrhea

## 2022-12-02 NOTE — Discharge Instructions (Addendum)
Recommend that you drink plenty of fluids including electrolyte containing fluids such as Pedialyte, Gatorade, use nausea medication as needed to help get something on your stomach.  Please return to the emergency department if you have severe worsening of your abdominal pain despite treatment.

## 2024-03-23 ENCOUNTER — Emergency Department (HOSPITAL_BASED_OUTPATIENT_CLINIC_OR_DEPARTMENT_OTHER): Admission: EM | Admit: 2024-03-23 | Discharge: 2024-03-23 | Disposition: A | Discharge status: Home / Self Care

## 2024-03-23 ENCOUNTER — Other Ambulatory Visit: Payer: Self-pay

## 2024-03-23 ENCOUNTER — Encounter (HOSPITAL_BASED_OUTPATIENT_CLINIC_OR_DEPARTMENT_OTHER): Payer: Self-pay | Admitting: Emergency Medicine

## 2024-03-23 DIAGNOSIS — M542 Cervicalgia: Secondary | ICD-10-CM | POA: Diagnosis present

## 2024-03-23 DIAGNOSIS — I10 Essential (primary) hypertension: Secondary | ICD-10-CM | POA: Diagnosis not present

## 2024-03-23 DIAGNOSIS — X500XXA Overexertion from strenuous movement or load, initial encounter: Secondary | ICD-10-CM | POA: Diagnosis not present

## 2024-03-23 DIAGNOSIS — M5412 Radiculopathy, cervical region: Secondary | ICD-10-CM | POA: Insufficient documentation

## 2024-03-23 DIAGNOSIS — Y990 Civilian activity done for income or pay: Secondary | ICD-10-CM | POA: Insufficient documentation

## 2024-03-23 HISTORY — DX: Essential (primary) hypertension: I10

## 2024-03-23 MED ORDER — METHOCARBAMOL 1000 MG PO TABS: 1000.0000 mg | ORAL_TABLET | Freq: Two times a day (BID) | ORAL | 0 refills | Status: AC

## 2024-03-23 MED ORDER — KETOROLAC TROMETHAMINE 15 MG/ML IJ SOLN
15.0000 mg | Freq: Once | INTRAMUSCULAR | Status: AC
  Administered 2024-03-23: 15 mg via INTRAMUSCULAR
  Filled 2024-03-23: qty 1

## 2024-03-23 MED ORDER — KETOROLAC TROMETHAMINE 10 MG PO TABS: 10.0000 mg | ORAL_TABLET | Freq: Four times a day (QID) | ORAL | 0 refills | Status: AC | PRN

## 2024-03-23 NOTE — ED Triage Notes (Signed)
 Pt states he woke up yesterday with a "crick" in his neck that has now migrated down to his rt. Shoulder. No injury

## 2024-03-23 NOTE — ED Provider Notes (Signed)
 [facility] Provider Note   CSN: [id number] Arrival date & time: 03/23/24  6962     History  Chief Complaint  Patient presents with   neck/shoulder pain    Jeffrey Newman is a 33 y.o. male.  33 year old male with past medical history of hypertension presenting to the emergency department today with neck pain and right shoulder pain.  The patient states that he woke up yesterday morning and was having some pain in his neck.  He states that as he was at work yesterday the pain was getting little worse.  He states the pain does radiate to his right shoulder and mostly in his trapezius.  He states it is worse with movement.  He denies any fevers or chills.  Denies a history of IV drug abuse.  Denies any weakness, numbness, tingling, bowel or bladder dysfunction, or saddle anesthesia.  He states that he does do some heavy lifting at work and usually does use his right arm predominantly.  Came to the ER today for further evaluation regarding this.  He has not been taking anything for the pain.        Home Medications Prior to Admission medications   Medication Sig Start Date End Date Taking? Authorizing Provider  ketorolac  (TORADOL ) 10 MG tablet Take 1 tablet (10 mg total) by mouth every 6 (six) hours as needed. 03/23/24  Yes Carin Charleston, MD  Methocarbamol 1000 MG TABS Take 1,000 mg by mouth 2 (two) times daily. 03/23/24  Yes Carin Charleston, MD  docusate sodium  (COLACE) 100 MG capsule Take 1 capsule (100 mg total) by mouth every 12 (twelve) hours. 09/21/20   Eldon Greenland, MD  ondansetron  (ZOFRAN ) 4 MG tablet Take 1 tablet (4 mg total) by mouth every 6 (six) hours. 12/02/22   Prosperi, Christian H, PA-C  pramoxine-hydrocortisone  (PROCTOCREAM-HC) 1-1 % rectal cream Place 1 application rectally 2 (two) times daily. 09/21/20   Eldon Greenland, MD      Allergies    Patient has no known allergies.    Review of Systems   Review of Systems   Musculoskeletal:  Positive for neck pain.  All other systems reviewed and are negative.   Physical Exam Updated Vital Signs BP (!) 142/104   Pulse 61   Temp 98 F (36.7 C) (Oral)   Resp 18   Ht 6\' 3"  (1.905 m)   Wt 72.1 kg   SpO2 100%   BMI 19.87 kg/m  Physical Exam Vitals and nursing note reviewed.   Gen: NAD Eyes: PERRL, EOMI HEENT: no oropharyngeal swelling Neck: trachea midline, the patient is tender over the mid cervical spine with right-sided paraspinal tenderness and tenderness over the trapezius, no meningismus Resp: clear to auscultation bilaterally Card: RRR, no murmurs, rubs, or gallops Extremities: no calf tenderness, no edema Vascular: 2+ radial pulses bilaterally, 2+ DP pulses bilaterally Neuro: The patient has equal strength and sensation throughout the bilateral upper and lower extremities Skin: no rashes Psyc: acting appropriately   ED Results / Procedures / Treatments   Labs (all labs ordered are listed, but only abnormal results are displayed) Labs Reviewed - No data to display  EKG None  Radiology No results found.  Procedures Procedures    Medications Ordered in ED Medications  ketorolac  (TORADOL ) 15 MG/ML injection 15 mg (has no administration in time range)    ED Course/ Medical Decision Making/ A&P  Medical Decision Making 33 year old male with past medical history of hypertension presenting to the emergency department today with symptoms consistent with cervical radiculopathy.  The patient's neuroexam is reassuring.  He is vascularly intact as well.  Given his age and with no history of trauma do not think that imaging is warranted at this time.  I will give the patient Toradol  here for pain.  He will be discharged with anti-inflammatories and muscle relaxers.  He is encouraged to follow-up with his primary care provider for reevaluation.  Risk Prescription drug management.           Final  Clinical Impression(s) / ED Diagnoses Final diagnoses:  Cervical radiculopathy    Rx / DC Orders ED Discharge Orders          Ordered    ketorolac  (TORADOL ) 10 MG tablet  Every 6 hours PRN        03/23/24 0729    Methocarbamol 1000 MG TABS  2 times daily        03/23/24 0729              Carin Charleston, MD 03/23/24 0730

## 2024-03-23 NOTE — Discharge Instructions (Addendum)
 Please take the ketorolac  as prescribed.  Take the methocarbamol as a muscle relaxer as needed for muscle spasms.  Do not drive drink alcohol taking this as it may make you drowsy.  Please follow-up with your doctor.  Return to the ER for fever or worsening symptoms as we discussed.

## 2024-03-23 NOTE — ED Notes (Signed)
 Pt alert and oriented X 4 at the time of discharge. RR even and unlabored. No acute distress noted. Pt verbalized understanding of discharge instructions as discussed. Pt ambulatory to lobby at time of discharge.

## 2024-09-05 ENCOUNTER — Emergency Department (HOSPITAL_BASED_OUTPATIENT_CLINIC_OR_DEPARTMENT_OTHER)
Admission: EM | Admit: 2024-09-05 | Discharge: 2024-09-05 | Disposition: A | Discharge status: Home / Self Care | Payer: Worker's Compensation | Attending: Emergency Medicine | Admitting: Emergency Medicine

## 2024-09-05 ENCOUNTER — Encounter (HOSPITAL_BASED_OUTPATIENT_CLINIC_OR_DEPARTMENT_OTHER): Payer: Self-pay

## 2024-09-05 ENCOUNTER — Other Ambulatory Visit: Payer: Self-pay

## 2024-09-05 ENCOUNTER — Emergency Department (HOSPITAL_BASED_OUTPATIENT_CLINIC_OR_DEPARTMENT_OTHER)

## 2024-09-05 DIAGNOSIS — W312XXA Contact with powered woodworking and forming machines, initial encounter: Secondary | ICD-10-CM | POA: Diagnosis not present

## 2024-09-05 DIAGNOSIS — Z23 Encounter for immunization: Secondary | ICD-10-CM | POA: Insufficient documentation

## 2024-09-05 DIAGNOSIS — Y990 Civilian activity done for income or pay: Secondary | ICD-10-CM | POA: Insufficient documentation

## 2024-09-05 DIAGNOSIS — S6992XA Unspecified injury of left wrist, hand and finger(s), initial encounter: Secondary | ICD-10-CM | POA: Diagnosis present

## 2024-09-05 DIAGNOSIS — S61012A Laceration without foreign body of left thumb without damage to nail, initial encounter: Secondary | ICD-10-CM | POA: Insufficient documentation

## 2024-09-05 MED ORDER — LIDOCAINE HCL 2 % IJ SOLN
5.0000 mL | Freq: Once | INTRAMUSCULAR | Status: AC
  Administered 2024-09-05: 100 mg
  Filled 2024-09-05: qty 20

## 2024-09-05 MED ORDER — TETANUS-DIPHTH-ACELL PERTUSSIS 5-2-15.5 LF-MCG/0.5 IM SUSP
0.5000 mL | Freq: Once | INTRAMUSCULAR | Status: AC
  Administered 2024-09-05: 0.5 mL via INTRAMUSCULAR
  Filled 2024-09-05: qty 0.5

## 2024-09-05 NOTE — ED Triage Notes (Signed)
 Pt presents with lac to left thumb. Pt reports finger got caught in saw at work.Bleeding controlled in triage

## 2024-09-05 NOTE — ED Provider Notes (Signed)
 [place] [facility] Provider Note   CSN: [id number] Arrival date & time: 09/05/24  9084     Patient presents with: Finger Injury   Jeffrey Newman is a 33 y.o. male.   33 year old male with no past medical history presents to the ED with a chief complaint of left thumb laceration while at work.  Patient reports Jeffrey Newman was working with a table saw when suddenly Jeffrey Newman cut part of his left thumb.  Jeffrey Newman reports pain to the area.  Has not taken any medication for improvement in symptoms.  Jeffrey Newman is unsure when his last immunization is.  Is able to fully range his finger.  No other complaints reported.  The history is provided by the patient.       Prior to Admission medications   Medication Sig Start Date End Date Taking? Authorizing Provider  docusate sodium  (COLACE) 100 MG capsule Take 1 capsule (100 mg total) by mouth every 12 (twelve) hours. 09/21/20   Midge Golas, MD  ketorolac  (TORADOL ) 10 MG tablet Take 1 tablet (10 mg total) by mouth every 6 (six) hours as needed. 03/23/24   Ula Prentice SAUNDERS, MD  Methocarbamol  1000 MG TABS Take 1,000 mg by mouth 2 (two) times daily. 03/23/24   Ula Prentice SAUNDERS, MD  ondansetron  (ZOFRAN ) 4 MG tablet Take 1 tablet (4 mg total) by mouth every 6 (six) hours. 12/02/22   Prosperi, Christian H, PA-C  pramoxine-hydrocortisone  (PROCTOCREAM-HC) 1-1 % rectal cream Place 1 application rectally 2 (two) times daily. 09/21/20   Midge Golas, MD    Allergies: Patient has no known allergies.    Review of Systems  Constitutional:  Negative for fever.  Skin:  Positive for wound.    Updated Vital Signs BP (!) 145/98 (BP Location: Right Arm)   Pulse 71   Temp 98.6 F (37 C) (Oral)   Resp 17   SpO2 100%   Physical Exam Vitals and nursing note reviewed.  Constitutional:      Appearance: Normal appearance.  HENT:     Head: Normocephalic and atraumatic.     Mouth/Throat:     Mouth: Mucous membranes are moist.  Cardiovascular:      Rate and Rhythm: Normal rate.  Pulmonary:     Effort: Pulmonary effort is normal.  Musculoskeletal:     Cervical back: Normal range of motion and neck supple.  Skin:    General: Skin is warm and dry.     Findings: Laceration present.     Comments: 1 to 2 cm laceration to the left thumb.  Neurological:     Mental Status: Jeffrey Newman is alert and oriented to person, place, and time.     (all labs ordered are listed, but only abnormal results are displayed) Labs Reviewed - No data to display  EKG: None  Radiology: DG Finger Thumb Left Result Date: 09/05/2024 CLINICAL DATA:  Left thumb laceration.  Work injury. EXAM: LEFT THUMB 2+V COMPARISON:  None Available. FINDINGS: Soft tissue injury along the distal thumb with a single punctate radiopaque structure in association. No underlying fracture. IMPRESSION: Soft tissue injury along the distal thumb. Associated punctate density may be along the wound surface. Foreign body not excluded. No fracture. Electronically Signed   By: Newell Eke M.D.   On: 09/05/2024 11:08     .Laceration Repair  Date/Time: 09/05/2024 11:58 AM  Performed by: Yaseen Gilberg, PA-C Authorized by: Dasiah Hooley, PA-C   Consent:    Consent obtained:  Verbal  Consent given by:  Patient   Risks discussed:  Infection Universal protocol:    Patient identity confirmed:  Verbally with patient Laceration details:    Location:  Finger   Finger location:  L thumb   Length (cm):  2   Depth (mm):  1 Treatment:    Area cleansed with:  Saline   Amount of cleaning:  Standard   Debridement:  Minimal Skin repair:    Repair method:  Sutures   Suture size:  4-0   Suture material:  Prolene   Number of sutures:  4 Approximation:    Approximation:  Close Repair type:    Repair type:  Simple Post-procedure details:    Dressing:  Bulky dressing and splint for protection   Procedure completion:  Tolerated well, no immediate complications    Medications Ordered in the ED   Tdap (ADACEL) injection 0.5 mL (0.5 mLs Intramuscular Given 09/05/24 1130)  lidocaine  (XYLOCAINE ) 2 % (with pres) injection 100 mg (100 mg Infiltration Given 09/05/24 1130)                                    Medical Decision Making Amount and/or Complexity of Data Reviewed Radiology: ordered.  Risk Prescription drug management.     Patient here status post laceration to the left thumb while working with a table saw at work.  Endorsing pain to the area, bleeding is controlled with pressure.  Not on any blood thinners, last immunization is unknown.  On exam Jeffrey Newman is neurovascularly intact does have good mobility.  X-ray without any concern for dislocation versus fracture.  Some questionable foreign body, extensively irrigated without any foreign body noted.  I did closed the wound primarily with 4-0 sutures with a total of 4 sutures.  Jeffrey Newman tolerated procedure adequately, I placed a dressing along with a splint for protection.  Tetanus immunization was also updated on today's visit.  Jeffrey Newman is hemodynamically stable for discharge.   Portions of this note were generated with Scientist, clinical (histocompatibility and immunogenetics). Dictation errors may occur despite best attempts at proofreading.   Final diagnoses:  Laceration of left thumb without damage to nail, foreign body presence unspecified, initial encounter    ED Discharge Orders     None          Maureen Broad, PA-C 09/05/24 1159    Long, Fonda MATSU, MD 09/06/24 [phone]

## 2024-09-05 NOTE — Discharge Instructions (Signed)
 You had 4 stitches placed to your left thumb, you will need to have these removed within 7 to 10 days.  You may return here, or have them removed with your primary care physician office.
